# Patient Record
Sex: Male | Born: 1975 | Race: White | Hispanic: No | Marital: Single | Smoking: Current every day smoker
Health system: Southern US, Community
[De-identification: ages and names within clinical notes are randomized; demographics above are authoritative.]

## PROBLEM LIST (undated history)

## (undated) DIAGNOSIS — I10 Essential (primary) hypertension: Secondary | ICD-10-CM

---

## 2017-10-03 ENCOUNTER — Encounter (HOSPITAL_COMMUNITY): Payer: Self-pay | Admitting: Emergency Medicine

## 2017-10-03 ENCOUNTER — Inpatient Hospital Stay (HOSPITAL_COMMUNITY)
Admission: EM | Admit: 2017-10-03 | Discharge: 2017-10-06 | DRG: 897 | Disposition: A | Discharge status: Home / Self Care | Payer: Self-pay | Attending: Internal Medicine | Admitting: Internal Medicine

## 2017-10-03 ENCOUNTER — Emergency Department (HOSPITAL_COMMUNITY): Payer: Self-pay

## 2017-10-03 DIAGNOSIS — F419 Anxiety disorder, unspecified: Secondary | ICD-10-CM | POA: Diagnosis present

## 2017-10-03 DIAGNOSIS — F17200 Nicotine dependence, unspecified, uncomplicated: Secondary | ICD-10-CM | POA: Diagnosis present

## 2017-10-03 DIAGNOSIS — F10239 Alcohol dependence with withdrawal, unspecified: Secondary | ICD-10-CM

## 2017-10-03 DIAGNOSIS — R441 Visual hallucinations: Secondary | ICD-10-CM

## 2017-10-03 DIAGNOSIS — L237 Allergic contact dermatitis due to plants, except food: Secondary | ICD-10-CM | POA: Diagnosis present

## 2017-10-03 DIAGNOSIS — Z79899 Other long term (current) drug therapy: Secondary | ICD-10-CM

## 2017-10-03 DIAGNOSIS — E876 Hypokalemia: Secondary | ICD-10-CM

## 2017-10-03 DIAGNOSIS — D509 Iron deficiency anemia, unspecified: Secondary | ICD-10-CM | POA: Diagnosis present

## 2017-10-03 DIAGNOSIS — F22 Delusional disorders: Secondary | ICD-10-CM

## 2017-10-03 DIAGNOSIS — I10 Essential (primary) hypertension: Secondary | ICD-10-CM | POA: Diagnosis present

## 2017-10-03 DIAGNOSIS — E872 Acidosis: Secondary | ICD-10-CM | POA: Diagnosis present

## 2017-10-03 DIAGNOSIS — F10939 Alcohol use, unspecified with withdrawal, unspecified: Secondary | ICD-10-CM | POA: Diagnosis present

## 2017-10-03 DIAGNOSIS — F329 Major depressive disorder, single episode, unspecified: Secondary | ICD-10-CM | POA: Diagnosis present

## 2017-10-03 DIAGNOSIS — N179 Acute kidney failure, unspecified: Secondary | ICD-10-CM | POA: Insufficient documentation

## 2017-10-03 DIAGNOSIS — F10231 Alcohol dependence with withdrawal delirium: Principal | ICD-10-CM | POA: Diagnosis present

## 2017-10-03 DIAGNOSIS — K7010 Alcoholic hepatitis without ascites: Secondary | ICD-10-CM | POA: Diagnosis present

## 2017-10-03 DIAGNOSIS — E871 Hypo-osmolality and hyponatremia: Secondary | ICD-10-CM

## 2017-10-03 DIAGNOSIS — R440 Auditory hallucinations: Secondary | ICD-10-CM

## 2017-10-03 DIAGNOSIS — F1210 Cannabis abuse, uncomplicated: Secondary | ICD-10-CM | POA: Diagnosis present

## 2017-10-03 DIAGNOSIS — F1110 Opioid abuse, uncomplicated: Secondary | ICD-10-CM | POA: Diagnosis present

## 2017-10-03 HISTORY — DX: Essential (primary) hypertension: I10

## 2017-10-03 LAB — URINALYSIS, ROUTINE W REFLEX MICROSCOPIC
Bacteria, UA: NONE SEEN
Bilirubin Urine: NEGATIVE
Glucose, UA: NEGATIVE mg/dL
Hgb urine dipstick: NEGATIVE
KETONES UR: 20 mg/dL — AB
Leukocytes, UA: NEGATIVE
Nitrite: NEGATIVE
PH: 5 (ref 5.0–8.0)
PROTEIN: 30 mg/dL — AB
SPECIFIC GRAVITY, URINE: 1.021 (ref 1.005–1.030)

## 2017-10-03 LAB — COMPREHENSIVE METABOLIC PANEL
ALBUMIN: 4.6 g/dL (ref 3.5–5.0)
ALT: 87 U/L — ABNORMAL HIGH (ref 17–63)
AST: 82 U/L — AB (ref 15–41)
Alkaline Phosphatase: 136 U/L — ABNORMAL HIGH (ref 38–126)
Anion gap: 19 — ABNORMAL HIGH (ref 5–15)
BUN: 16 mg/dL (ref 6–20)
CHLORIDE: 98 mmol/L — AB (ref 101–111)
CO2: 16 mmol/L — ABNORMAL LOW (ref 22–32)
Calcium: 9.3 mg/dL (ref 8.9–10.3)
Creatinine, Ser: 1.26 mg/dL — ABNORMAL HIGH (ref 0.61–1.24)
GFR calc Af Amer: >60 mL/min (ref >60)
GFR calc non Af Amer: >60 mL/min (ref >60)
Glucose, Bld: 94 mg/dL (ref 65–99)
POTASSIUM: 3 mmol/L — AB (ref 3.5–5.1)
Sodium: 133 mmol/L — ABNORMAL LOW (ref 135–145)
Total Bilirubin: 2 mg/dL — ABNORMAL HIGH (ref 0.3–1.2)
Total Protein: 7.5 g/dL (ref 6.5–8.1)

## 2017-10-03 LAB — CBC WITH DIFFERENTIAL/PLATELET
BASOS ABS: 0 10*3/uL (ref 0.0–0.1)
BASOS PCT: 0 %
Basophils Absolute: 0 K/uL (ref 0.0–0.1)
Basophils Relative: 0 %
EOS ABS: 0 10*3/uL (ref 0.0–0.7)
EOS PCT: 0 %
Eosinophils Absolute: 0.1 K/uL (ref 0.0–0.7)
Eosinophils Relative: 1 %
HCT: 30.1 % — ABNORMAL LOW (ref 39.0–52.0)
HCT: 27.1 % — ABNORMAL LOW (ref 39.0–52.0)
Hemoglobin: 10 g/dL — ABNORMAL LOW (ref 13.0–17.0)
Hemoglobin: 9 g/dL — ABNORMAL LOW (ref 13.0–17.0)
LYMPHS ABS: 2 10*3/uL (ref 0.7–4.0)
Lymphocytes Relative: 17 %
Lymphocytes Relative: 28 %
Lymphs Abs: 1.6 K/uL (ref 0.7–4.0)
MCH: 30.2 pg (ref 26.0–34.0)
MCH: 30.3 pg (ref 26.0–34.0)
MCHC: 33.2 g/dL (ref 30.0–36.0)
MCHC: 33.2 g/dL (ref 30.0–36.0)
MCV: 91.2 fL (ref 78.0–100.0)
MCV: 90.9 fL (ref 78.0–100.0)
Monocytes Absolute: 1.5 10*3/uL — ABNORMAL HIGH (ref 0.1–1.0)
Monocytes Absolute: 0.7 K/uL (ref 0.1–1.0)
Monocytes Relative: 13 %
Monocytes Relative: 12 %
Neutro Abs: 8.2 10*3/uL — ABNORMAL HIGH (ref 1.7–7.7)
Neutro Abs: 3.4 K/uL (ref 1.7–7.7)
Neutrophils Relative %: 70 %
Neutrophils Relative %: 59 %
PLATELETS: 301 10*3/uL (ref 150–400)
Platelets: 276 K/uL (ref 150–400)
RBC: 2.98 MIL/uL — ABNORMAL LOW (ref 4.22–5.81)
RBC: 3.3 MIL/uL — AB (ref 4.22–5.81)
RDW: 15 % (ref 11.5–15.5)
RDW: 15.1 % (ref 11.5–15.5)
WBC: 11.7 10*3/uL — AB (ref 4.0–10.5)
WBC: 5.8 K/uL (ref 4.0–10.5)

## 2017-10-03 LAB — BASIC METABOLIC PANEL WITH GFR
Anion gap: 10 (ref 5–15)
BUN: 10 mg/dL (ref 6–20)
CO2: 20 mmol/L — ABNORMAL LOW (ref 22–32)
Calcium: 8.1 mg/dL — ABNORMAL LOW (ref 8.9–10.3)
Chloride: 110 mmol/L (ref 101–111)
Creatinine, Ser: 0.78 mg/dL (ref 0.61–1.24)
GFR calc Af Amer: >60 mL/min
GFR calc non Af Amer: >60 mL/min
Glucose, Bld: 97 mg/dL (ref 65–99)
Potassium: 3.1 mmol/L — ABNORMAL LOW (ref 3.5–5.1)
Sodium: 140 mmol/L (ref 135–145)

## 2017-10-03 LAB — LACTIC ACID, PLASMA: Lactic Acid, Venous: 0.5 mmol/L (ref 0.5–1.9)

## 2017-10-03 LAB — GLUCOSE, CAPILLARY
Glucose-Capillary: 105 mg/dL — ABNORMAL HIGH (ref 65–99)
Glucose-Capillary: 151 mg/dL — ABNORMAL HIGH (ref 65–99)

## 2017-10-03 LAB — SALICYLATE LEVEL

## 2017-10-03 LAB — RAPID URINE DRUG SCREEN, HOSP PERFORMED
AMPHETAMINES: NOT DETECTED
BARBITURATES: POSITIVE — AB
BENZODIAZEPINES: NOT DETECTED
Cocaine: NOT DETECTED
Opiates: NOT DETECTED
TETRAHYDROCANNABINOL: POSITIVE — AB

## 2017-10-03 LAB — ETHANOL: Alcohol, Ethyl (B): <10 mg/dL (ref <10)

## 2017-10-03 LAB — MRSA PCR SCREENING: MRSA by PCR: NEGATIVE

## 2017-10-03 LAB — ACETAMINOPHEN LEVEL: Acetaminophen (Tylenol), Serum: <10 ug/mL — ABNORMAL LOW (ref 10–30)

## 2017-10-03 MED ORDER — LORAZEPAM 2 MG/ML IJ SOLN
0.0000 mg | Freq: Four times a day (QID) | INTRAMUSCULAR | Status: DC
  Administered 2017-10-03: 1 mg via INTRAVENOUS
  Administered 2017-10-03 (×2): 2 mg via INTRAVENOUS
  Filled 2017-10-03 (×3): qty 1

## 2017-10-03 MED ORDER — ASPIRIN 81 MG PO CHEW: 324.0000 mg | CHEWABLE_TABLET | ORAL | Status: DC

## 2017-10-03 MED ORDER — LORAZEPAM 1 MG PO TABS: 0.0000 mg | ORAL_TABLET | Freq: Two times a day (BID) | ORAL | Status: DC

## 2017-10-03 MED ORDER — AMLODIPINE BESYLATE 5 MG PO TABS
5.0000 mg | ORAL_TABLET | Freq: Every day | ORAL | Status: DC
  Administered 2017-10-04 – 2017-10-06 (×3): 5 mg via ORAL
  Filled 2017-10-03 (×4): qty 1

## 2017-10-03 MED ORDER — DEXMEDETOMIDINE HCL IN NACL 200 MCG/50ML IV SOLN
0.2000 ug/kg/h | INTRAVENOUS | Status: DC
  Filled 2017-10-03: qty 50

## 2017-10-03 MED ORDER — LORAZEPAM 2 MG/ML IJ SOLN
1.0000 mg | Freq: Once | INTRAMUSCULAR | Status: AC
  Administered 2017-10-03: 1 mg via INTRAMUSCULAR
  Filled 2017-10-03: qty 1

## 2017-10-03 MED ORDER — DICYCLOMINE HCL 10 MG PO CAPS
10.0000 mg | ORAL_CAPSULE | Freq: Three times a day (TID) | ORAL | Status: DC | PRN
  Filled 2017-10-03: qty 1

## 2017-10-03 MED ORDER — LORAZEPAM 1 MG PO TABS
0.0000 mg | ORAL_TABLET | Freq: Four times a day (QID) | ORAL | Status: DC
  Administered 2017-10-04 (×2): 1 mg via ORAL
  Filled 2017-10-03: qty 1
  Filled 2017-10-03: qty 2
  Filled 2017-10-03: qty 1

## 2017-10-03 MED ORDER — ASPIRIN 300 MG RE SUPP: 300.0000 mg | RECTAL | Status: DC

## 2017-10-03 MED ORDER — DEXMEDETOMIDINE HCL IN NACL 200 MCG/50ML IV SOLN: 0.4000 ug/kg/h | INTRAVENOUS | Status: DC

## 2017-10-03 MED ORDER — ONDANSETRON HCL 4 MG PO TABS
4.0000 mg | ORAL_TABLET | Freq: Three times a day (TID) | ORAL | Status: DC | PRN
  Administered 2017-10-04: 4 mg via ORAL
  Filled 2017-10-03: qty 1

## 2017-10-03 MED ORDER — POTASSIUM CHLORIDE 10 MEQ/100ML IV SOLN: 10.0000 meq | INTRAVENOUS | Status: DC

## 2017-10-03 MED ORDER — THIAMINE HCL 100 MG/ML IJ SOLN
100.0000 mg | Freq: Every day | INTRAMUSCULAR | Status: DC
  Administered 2017-10-03: 100 mg via INTRAVENOUS
  Filled 2017-10-03 (×2): qty 2
  Filled 2017-10-03: qty 1

## 2017-10-03 MED ORDER — SODIUM CHLORIDE 0.9 % IV BOLUS
1000.0000 mL | Freq: Once | INTRAVENOUS | Status: AC
  Administered 2017-10-03: 1000 mL via INTRAVENOUS

## 2017-10-03 MED ORDER — LORAZEPAM 2 MG/ML IJ SOLN: 0.0000 mg | Freq: Two times a day (BID) | INTRAMUSCULAR | Status: DC

## 2017-10-03 MED ORDER — DEXMEDETOMIDINE HCL IN NACL 200 MCG/50ML IV SOLN
0.4000 ug/kg/h | INTRAVENOUS | Status: DC
  Administered 2017-10-03: 0.4 ug/kg/h via INTRAVENOUS
  Filled 2017-10-03 (×2): qty 50

## 2017-10-03 MED ORDER — LOSARTAN POTASSIUM 50 MG PO TABS
100.0000 mg | ORAL_TABLET | Freq: Every day | ORAL | Status: DC
  Administered 2017-10-04 – 2017-10-06 (×3): 100 mg via ORAL
  Filled 2017-10-03 (×4): qty 2

## 2017-10-03 MED ORDER — THIAMINE HCL 100 MG/ML IJ SOLN
INTRAVENOUS | Status: DC
  Administered 2017-10-03 – 2017-10-04 (×2): via INTRAVENOUS
  Filled 2017-10-03 (×10): qty 1000

## 2017-10-03 MED ORDER — BUTALBITAL-APAP-CAFFEINE 50-325-40 MG PO TABS
1.0000 | ORAL_TABLET | ORAL | Status: DC | PRN
Start: 2017-10-03 — End: 2017-10-06
  Administered 2017-10-03 – 2017-10-06 (×9): 1 via ORAL
  Filled 2017-10-03 (×9): qty 1

## 2017-10-03 MED ORDER — DEXMEDETOMIDINE HCL IN NACL 400 MCG/100ML IV SOLN
0.2000 ug/kg/h | INTRAVENOUS | Status: DC
  Administered 2017-10-03: 0.2 ug/kg/h via INTRAVENOUS
  Filled 2017-10-03: qty 100

## 2017-10-03 MED ORDER — SODIUM CHLORIDE 0.9 % IV SOLN
250.0000 mL | INTRAVENOUS | Status: DC | PRN
  Administered 2017-10-03: 250 mL via INTRAVENOUS

## 2017-10-03 MED ORDER — POTASSIUM CHLORIDE CRYS ER 20 MEQ PO TBCR
40.0000 meq | EXTENDED_RELEASE_TABLET | Freq: Once | ORAL | Status: AC
  Administered 2017-10-03: 40 meq via ORAL
  Filled 2017-10-03 (×2): qty 2

## 2017-10-03 MED ORDER — ENOXAPARIN SODIUM 40 MG/0.4ML ~~LOC~~ SOLN
40.0000 mg | SUBCUTANEOUS | Status: DC
  Administered 2017-10-03 – 2017-10-06 (×4): 40 mg via SUBCUTANEOUS
  Filled 2017-10-03 (×4): qty 0.4

## 2017-10-03 MED ORDER — VITAMIN B-1 100 MG PO TABS
100.0000 mg | ORAL_TABLET | Freq: Every day | ORAL | Status: DC
  Administered 2017-10-04: 100 mg via ORAL
  Filled 2017-10-03 (×2): qty 1

## 2017-10-03 MED ORDER — LORAZEPAM 2 MG/ML IJ SOLN
1.0000 mg | INTRAMUSCULAR | Status: DC | PRN
  Administered 2017-10-04: 1 mg via INTRAVENOUS
  Filled 2017-10-03: qty 1

## 2017-10-03 MED ORDER — LORAZEPAM 2 MG/ML IJ SOLN
2.0000 mg | Freq: Once | INTRAMUSCULAR | Status: AC
  Administered 2017-10-03: 2 mg via INTRAMUSCULAR
  Filled 2017-10-03: qty 1

## 2017-10-03 MED ORDER — LORAZEPAM 2 MG/ML IJ SOLN
2.0000 mg | INTRAMUSCULAR | Status: AC
  Administered 2017-10-03: 2 mg via INTRAMUSCULAR
  Filled 2017-10-03: qty 1

## 2017-10-03 MED ORDER — SODIUM CHLORIDE 0.9 % IV SOLN
INTRAVENOUS | Status: DC
  Administered 2017-10-03 – 2017-10-04 (×3): via INTRAVENOUS

## 2017-10-03 NOTE — H&P (Signed)
PULMONARY / CRITICAL CARE MEDICINE   Name: Terry Burns MRN: [id number] DOB: 22-Jul-1975    ADMISSION DATE:  10/03/2017 CONSULTATION DATE:  10/03/2017  REFERRING MD:  ED  CHIEF COMPLAINT: delirium   HISTORY OF PRESENT ILLNESS:   42 yr old male with ETOH abuse and HTN he comes in with hallucinations to the ED. Early in the morning while in the ED he started showing signs of ETOH withdrawal. eICU were called for admission. I was paged by the ED nurse since patient has not been evaluated by ICU and I was not informed about it earlier. I came immediately to assess the patient in ED he was on precedex and was sleepy. I tried to wake him up but he was lethargic and could not take any history from him and no family around except sitter.   PAST MEDICAL HISTORY :  He  has a past medical history of Hypertension.  PAST SURGICAL HISTORY: He  has no past surgical history on file.  Allergies  Allergen Reactions  . Augmentin [Amoxicillin-Pot Clavulanate] Nausea And Vomiting  . Ciprofloxacin Other (See Comments)    No current facility-administered medications on file prior to encounter.    Current Outpatient Medications on File Prior to Encounter  Medication Sig  . amLODipine (NORVASC) 5 MG tablet Take 5 mg by mouth daily.  Marland Kitchen dicyclomine (BENTYL) 10 MG capsule Take 10 mg by mouth 3 (three) times daily as needed for spasms.  Marland Kitchen losartan (COZAAR) 100 MG tablet Take 100 mg by mouth daily.  . ondansetron (ZOFRAN) 4 MG tablet Take 4 mg by mouth every 8 (eight) hours as needed for nausea or vomiting.    FAMILY HISTORY:  Could not be obtained due to altered mental status   SOCIAL HISTORY: Could not be obtained due to altered mental status   REVIEW OF SYSTEMS:   Could not be obtained due to altered mental status      VITAL SIGNS: BP 100/89   Pulse 90   Temp 98.9 F (37.2 C) (Oral)   Resp 17   Ht  (1.854 m)   Wt 72.8 kg (160 lb 7.9 oz)   SpO2 96%   BMI 21.17 kg/m    HEMODYNAMICS:    VENTILATOR SETTINGS:    INTAKE / OUTPUT: No intake/output data recorded.  PHYSICAL EXAMINATION: General:  Lethargic on precedex drip Neuro: lethargic moving all extremities CN 1-12 intact  HEENT:  Moist  Cardiovascular:  Normal heart sounds no murmrurs  Lungs: clear no wheezing no crackles  Abdomen:  Soft no tednerenss  Musculoskeletal:  No edema  Skin:  No rash  LABS:  BMET Recent Labs  Lab 10/03/17 0057  NA 133*  K 3.0*  CL 98*  CO2 16*  BUN 16  CREATININE 1.26*  GLUCOSE 94    Electrolytes Recent Labs  Lab 10/03/17 0057  CALCIUM 9.3    CBC Recent Labs  Lab 10/03/17 0057  WBC 11.7*  HGB 10.0*  HCT 30.1*  PLT 301    Coag's No results for input(s): APTT, INR in the last 168 hours.  Sepsis Markers No results for input(s): LATICACIDVEN, PROCALCITON, O2SATVEN in the last 168 hours.  ABG No results for input(s): PHART, PCO2ART, PO2ART in the last 168 hours.  Liver Enzymes Recent Labs  Lab 10/03/17 0057  AST 82*  ALT 87*  ALKPHOS 136*  BILITOT 2.0*  ALBUMIN 4.6    Cardiac Enzymes No results for input(s): TROPONINI, PROBNP in the last 168 hours.  Glucose No results for input(s): GLUCAP in the last 168 hours.  Imaging Ct Head Wo Contrast  Result Date: 10/03/2017 CLINICAL DATA:  42 year old male with hallucination. EXAM: CT HEAD WITHOUT CONTRAST TECHNIQUE: Contiguous axial images were obtained from the base of the skull through the vertex without intravenous contrast. COMPARISON:  None. FINDINGS: Brain: The ventricles and sulci appropriate size for patient's age. The gray-white matter discrimination is preserved. There is no acute intracranial hemorrhage. No mass effect or midline shift. Mild prominence of the CSF space in the posterior fossa may represent an arachnoid cyst or mildly dilated cisterna magna. Vascular: No hyperdense vessel or unexpected calcification. Skull: Normal. Negative for fracture or focal lesion.  Sinuses/Orbits: No acute finding. Other: None IMPRESSION: No acute intracranial pathology. Electronically Signed   By: Elgie Collard M.D.   On: 10/03/2017 01:52      ASSESSMENT / PLAN:  Severe alcohol withdrawal with delerium tremens Hyponatremia Hypokalemia AKI Metabolic acidosis  Acute hepatitis seondary to alcohol  Anemia no signs for bleeding   Plan: - precedex drip - IVF  - send for lactic acid - repeat BMP - replace potasium - follow sodium level - CIWA protocol  - thiamin and MVI  - follow urine output and renal function - admit to ICU      Pulmonary and Critical Care Medicine [facility] Pager: [phone]  10/03/2017, 1:34 PM

## 2017-10-03 NOTE — ED Notes (Signed)
CCM at bedside 

## 2017-10-03 NOTE — ED Notes (Signed)
Pt nose ring remains, unable to be removed.

## 2017-10-03 NOTE — ED Notes (Signed)
Sitter at the bedside.

## 2017-10-03 NOTE — ED Notes (Signed)
repaged iintensivist
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
## 2017-10-03 NOTE — ED Notes (Signed)
Pt states his wife Jotham Ahn [phone]) can speak with any staff member about his plan of care

## 2017-10-03 NOTE — ED Notes (Signed)
Notified pharmacy for replacement precedex.

## 2017-10-03 NOTE — ED Notes (Signed)
Pt wife updated on change in status, very appreciative.  [facility] also updated.

## 2017-10-03 NOTE — Plan of Care (Signed)

## 2017-10-03 NOTE — ED Notes (Signed)
Belongings removed, pt placed in paper scrubs. Wanded by security. Family taking ALL belongings.

## 2017-10-03 NOTE — ED Triage Notes (Signed)
Pt brought in with sister and wife, per family pt has been hallucinating for past few days as well as not sleeping. Also reports pt has been abusing ETOH and anti anxiety medications. Several family issues going on, pt has been under a lot of stress. Denies SI/HI. Pt has no psychiatric hx. Dr. Elesa Massed in triage to evaluate pt.

## 2017-10-03 NOTE — Progress Notes (Signed)
TTS received a phone call from [place], RN who states the pt is being admitted medically to ICU due to alcohol withdrawal.  Princess Bruins, MSW, LCSW Therapeutic Triage Specialist  [phone]

## 2017-10-03 NOTE — ED Notes (Signed)
Md made aware of Pt's CIWA; to be moved to room 23

## 2017-10-03 NOTE — Progress Notes (Signed)
Pt.'s wife at bedside and states her husband has had "chronic diarrhea and N/V for the past year now" and has a GI doctor and GP that he sees regularly. Pt.'s wife also stated that the pt. has many home medications that are not listed in out system. Wife informed that an updated home medication list would be valuable and she stated she could bring from home. Dr. Minette Headland informed of the information listed above.

## 2017-10-03 NOTE — ED Notes (Signed)
Patient continues to speak to people that are not there; ripped his paper scrubs and states that he was "working on Nurse, learning disability" and then asked for shorts. I obliged patient and cut off his paper scrubs to make shorts. Patient repeatedly stated that he was wearing "magic shorts" and frequently yells out random names and "stop!".

## 2017-10-03 NOTE — ED Provider Notes (Addendum)
TIME SEEN: 12:53 AM  CHIEF COMPLAINT: Hallucinations, paranoia  HPI: Patient is a 42 year old male with history of hypertension, alcohol abuse who presents to the emergency department with hallucinations and paranoia.  Patient reports this is been ongoing for several days.  He states that he feels like neighbors have been on the roof of his house trying to get into his house, breaking and stealing things and putting devices to listen to him.  He states that he is getting scared because he realizes that no one else sees these things.  He has never had any psychiatric illness before.  No history of psychiatric hospitalization.  Reports that he does drink regularly approximately 6-9 drinks a day but does not drink every day and has never had withdrawal or hallucinations before.  Did drink 2 glasses of wine tonight.  No drug use.  No head injury.  No medical complaints.  Sister had an episode of similar symptoms years ago and required psychiatric hospitalization for a "psychotic break".  ROS: See HPI Constitutional: no fever  Eyes: no drainage  ENT: no runny nose   Cardiovascular:  no chest pain  Resp: no SOB  GI: no vomiting GU: no dysuria Integumentary: no rash  Allergy: no hives  Musculoskeletal: no leg swelling  Neurological: no slurred speech ROS otherwise negative  PAST MEDICAL HISTORY/PAST SURGICAL HISTORY:  Past Medical History:  Diagnosis Date  . Hypertension     MEDICATIONS:  Prior to Admission medications   Not on File    ALLERGIES:  Allergies not on file  SOCIAL HISTORY:  Social History   Tobacco Use  . Smoking status: Not on file  . Smokeless tobacco: Never Used  Substance Use Topics  . Alcohol use: Yes    FAMILY HISTORY: No family history on file.  EXAM: BP (!) 147/89 (BP Location: Right Arm)   Pulse 100   Temp 98.9 F (37.2 C) (Oral)   Resp 16   SpO2 100%  CONSTITUTIONAL: Alert and oriented and responds appropriately to questions. Well-appearing;  well-nourished, appears very nervous and is looking around the room rapidly HEAD: Normocephalic EYES: Conjunctivae clear, pupils appear equal, EOMI ENT: normal nose; moist mucous membranes NECK: Supple, no meningismus, no nuchal rigidity, no LAD  CARD: RRR; S1 and S2 appreciated; no murmurs, no clicks, no rubs, no gallops RESP: Normal chest excursion without splinting or tachypnea; breath sounds clear and equal bilaterally; no wheezes, no rhonchi, no rales, no hypoxia or respiratory distress, speaking full sentences ABD/GI: Normal bowel sounds; non-distended; soft, non-tender, no rebound, no guarding, no peritoneal signs, no hepatosplenomegaly BACK:  The back appears normal and is non-tender to palpation, there is no CVA tenderness EXT: Normal ROM in all joints; non-tender to palpation; no edema; normal capillary refill; no cyanosis, no calf tenderness or swelling    SKIN: Normal color for age and race; warm; no rash NEURO: Moves all extremities equally PSYCH: Endorses hallucinations, paranoia.  Denies SI or HI.  Appears very anxious, nervous.  MEDICAL DECISION MAKING: Patient here with hallucinations, paranoia.  History of sister with similar symptoms.  Family at bedside very concerned for his health.  At this time he agrees to stay voluntarily.  We will start with a medical work-up including labs, urine, EKG, head CT.  If this is unremarkable, will consult TTS.  He agrees to take a dose of IM Ativan to help with his restlessness, anxiety.  I do feel that if patient were to decide to leave the emergency department he  would need to be involuntarily committed.  I have filled out his IVC paperwork.  ED PROGRESS: 2:00 AM  Patient's medical work-up is unremarkable other than some mild dehydration.  Labs show mild bump in creatinine of 1.26, urine shows small ketones.  Will encourage oral fluids.  I do not think this is the cause of his psychosis today.  He does have mildly elevated liver function test  likely in the setting of alcohol abuse.  His alcohol is negative today.  Drug screen is positive for barbiturates and THC.   We will consult TTS.  At this time he is medically cleared.  Patient is comfortable with this plan and agrees to stay voluntarily at this time for evaluation by TTS.  The Ativan has helped calm him down he is still feeling very "jittery".  Will give another dose of IM Ativan after TTS has talked to the patient.  2:45 AM  D/w Ala Dach with TTS.  They have recommended inpatient psychiatric treatment and I agree.  They will look for placement.  Nursing staff has updated patient and family.   I reviewed all nursing notes, vitals, pertinent previous records, EKGs, lab and urine results, imaging (as available).     EKG Interpretation  Date/Time:  Saturday Oct 03 2017 01:30:53 EDT Ventricular Rate:  96 PR Interval:  158 QRS Duration: 92 QT Interval:  388 QTC Calculation: 490 R Axis:   73 Text Interpretation:  Normal sinus rhythm Prolonged QT Abnormal ECG No old tracing to compare Confirmed by Ward, Baxter Hire [phone]) on 10/03/2017 1:42:01 AM         Ward, Layla Maw, DO 10/03/17 0247     5:45 AM I was asked to reevaluate patient by nursing staff.  Patient is now tachycardic, diaphoretic, extremely agitated, confused, incoherent.  His CIWAs are in the upper 20s.  This is despite multiple rounds of oral and IM Ativan.  I suspect now that he may be having symptoms of alcohol withdrawal.  I feel he will need Precedex infusion and admission to the ICU.  6:00 AM  D/w Dr. Darrick Penna with critical care.  They will see patient for admission.   CRITICAL CARE Performed by: Rochele Raring   Total critical care time: 45 minutes  Critical care time was exclusive of separately billable procedures and treating other patients.  Critical care was necessary to treat or prevent imminent or life-threatening deterioration.  Critical care was time spent personally by me on the following  activities: development of treatment plan with patient and/or surrogate as well as nursing, discussions with consultants, evaluation of patient's response to treatment, examination of patient, obtaining history from patient or surrogate, ordering and performing treatments and interventions, ordering and review of laboratory studies, ordering and review of radiographic studies, pulse oximetry and re-evaluation of patient's condition.    Ward, Layla Maw, DO 10/03/17 [phone]

## 2017-10-04 DIAGNOSIS — F10231 Alcohol dependence with withdrawal delirium: Principal | ICD-10-CM

## 2017-10-04 DIAGNOSIS — K7010 Alcoholic hepatitis without ascites: Secondary | ICD-10-CM

## 2017-10-04 LAB — BASIC METABOLIC PANEL
Anion gap: 7 (ref 5–15)
BUN: 7 mg/dL (ref 6–20)
CALCIUM: 7.9 mg/dL — AB (ref 8.9–10.3)
CO2: 21 mmol/L — ABNORMAL LOW (ref 22–32)
Chloride: 113 mmol/L — ABNORMAL HIGH (ref 101–111)
Creatinine, Ser: 0.64 mg/dL (ref 0.61–1.24)
GFR calc Af Amer: >60 mL/min (ref >60)
Glucose, Bld: 97 mg/dL (ref 65–99)
POTASSIUM: 3.5 mmol/L (ref 3.5–5.1)
Sodium: 141 mmol/L (ref 135–145)

## 2017-10-04 LAB — CBC
HEMATOCRIT: 25.9 % — AB (ref 39.0–52.0)
Hemoglobin: 8.5 g/dL — ABNORMAL LOW (ref 13.0–17.0)
MCH: 30.1 pg (ref 26.0–34.0)
MCHC: 32.8 g/dL (ref 30.0–36.0)
MCV: 91.8 fL (ref 78.0–100.0)
Platelets: 272 10*3/uL (ref 150–400)
RBC: 2.82 MIL/uL — ABNORMAL LOW (ref 4.22–5.81)
RDW: 15.2 % (ref 11.5–15.5)
WBC: 5.1 10*3/uL (ref 4.0–10.5)

## 2017-10-04 LAB — HIV ANTIBODY (ROUTINE TESTING W REFLEX): HIV Screen 4th Generation wRfx: NONREACTIVE

## 2017-10-04 LAB — GLUCOSE, CAPILLARY: GLUCOSE-CAPILLARY: 114 mg/dL — AB (ref 65–99)

## 2017-10-04 MED ORDER — TRAZODONE HCL 100 MG PO TABS
100.0000 mg | ORAL_TABLET | Freq: Every evening | ORAL | Status: DC | PRN
  Administered 2017-10-04 – 2017-10-05 (×2): 100 mg via ORAL
  Filled 2017-10-04 (×3): qty 1

## 2017-10-04 MED ORDER — LORAZEPAM 1 MG PO TABS
0.0000 mg | ORAL_TABLET | Freq: Four times a day (QID) | ORAL | Status: AC
  Administered 2017-10-04: 2 mg via ORAL
  Administered 2017-10-04: 1 mg via ORAL
  Filled 2017-10-04: qty 2
  Filled 2017-10-04 (×2): qty 1
  Filled 2017-10-04: qty 2

## 2017-10-04 MED ORDER — DIPHENHYDRAMINE HCL 25 MG PO CAPS
25.0000 mg | ORAL_CAPSULE | Freq: Four times a day (QID) | ORAL | Status: DC | PRN
  Administered 2017-10-04 – 2017-10-05 (×2): 25 mg via ORAL
  Filled 2017-10-04 (×2): qty 1

## 2017-10-04 MED ORDER — LORAZEPAM 2 MG/ML IJ SOLN
1.0000 mg | Freq: Four times a day (QID) | INTRAMUSCULAR | Status: DC | PRN
  Administered 2017-10-05: 1 mg via INTRAVENOUS
  Filled 2017-10-04: qty 1

## 2017-10-04 MED ORDER — LORAZEPAM 1 MG PO TABS: 0.0000 mg | ORAL_TABLET | Freq: Two times a day (BID) | ORAL | Status: DC

## 2017-10-04 MED ORDER — CALAMINE EX LOTN
TOPICAL_LOTION | CUTANEOUS | Status: DC | PRN
  Administered 2017-10-05: 12:00:00 via TOPICAL
  Filled 2017-10-04: qty 177

## 2017-10-04 MED ORDER — LORAZEPAM 1 MG PO TABS
1.0000 mg | ORAL_TABLET | Freq: Four times a day (QID) | ORAL | Status: DC | PRN
  Administered 2017-10-04 – 2017-10-06 (×3): 1 mg via ORAL
  Filled 2017-10-04 (×2): qty 1

## 2017-10-04 NOTE — Progress Notes (Signed)
PULMONARY / CRITICAL CARE MEDICINE   Name: Terry Burns MRN: [id number] DOB: [birth year]    ADMISSION DATE:  10/03/2017 CONSULTATION DATE:  10/03/2017  REFERRING MD:  ED  CHIEF COMPLAINT: delirium   BRIEF:   HISTORY OF PRESENT ILLNESS:   42 y/o male with EtOH abuse was admitted for EtOH withdrawal.  PAST MEDICAL HISTORY :  He  has a past medical history of Hypertension.  SUBJECTIVE: Feels much better this morning Precedex off   VITAL SIGNS: BP (!) 142/86   Pulse 96   Temp 98.3 F (36.8 C) (Oral)   Resp 16   Ht  (1.854 m)   Wt 147 lb 0.8 oz (66.7 kg)   SpO2 100%   BMI 19.40 kg/m   HEMODYNAMICS:    VENTILATOR SETTINGS:    INTAKE / OUTPUT: I/O last 3 completed shifts: In: 3855.4 [P.O.:240; I.V.:3615.4] Out: 680 [Urine:680]  PHYSICAL EXAMINATION:  General:  Resting comfortably in bed HENT: NCAT OP clear PULM: CTA B, normal effort CV: RRR, no mgr GI: BS+, soft, nontender MSK: normal bulk and tone Neuro: awake, alert, no distress, MAEW    LABS:  BMET Recent Labs  Lab 10/03/17 0057 10/03/17 1521 10/04/17 0526  NA 133* 140 141  K 3.0* 3.1* 3.5  CL 98* 110 113*  CO2 16* 20* 21*  BUN CREATININE 1.26* 0.78 0.64  GLUCOSE 94 97 97    Electrolytes Recent Labs  Lab 10/03/17 0057 10/03/17 1521 10/04/17 0526  CALCIUM 9.3 8.1* 7.9*    CBC Recent Labs  Lab 10/03/17 0057 10/03/17 1521 10/04/17 0526  WBC 11.7* 5.8 5.1  HGB 10.0* 9.0* 8.5*  HCT 30.1* 27.1* 25.9*  PLT 301 276 272    Coag's No results for input(s): APTT, INR in the last 168 hours.  Sepsis Markers Recent Labs  Lab 10/03/17 1521  LATICACIDVEN 0.5    ABG No results for input(s): PHART, PCO2ART, PO2ART in the last 168 hours.  Liver Enzymes Recent Labs  Lab 10/03/17 0057  AST 82*  ALT 87*  ALKPHOS 136*  BILITOT 2.0*  ALBUMIN 4.6    Cardiac Enzymes No results for input(s): TROPONINI, PROBNP in the last 168 hours.  Glucose Recent Labs  Lab  10/03/17 1506 10/03/17 2027 10/04/17 0035  GLUCAP 105* 151* 114*    Imaging No results found.    ASSESSMENT / PLAN:  Severe EtOH withdrawal with hallucinations, delirium tremens AKI> improved Mild alcoholic hepatitis Chronic normocytic anemia without bleeding, likely due to EtOH use   Plan: Stop precedex Continue thiamine Continue MVI Advance diet  Continue PRN ativan with CIWA Trasnfer to floor, TRH   Heber [place], MD [facility] Pager: [phone] Cell: [phone] After 3pm or if no response, call [phone]   10/04/2017, 10:35 AM

## 2017-10-04 NOTE — Progress Notes (Signed)
Report called to 5W 7 jRN, tx w/ vss w/ Lona Kettle. Wife updated to new room and phone #.

## 2017-10-04 NOTE — Progress Notes (Signed)
[facility]  He tells me he is not depressed nor have any intention to kill himself.    Heber Cornland, MD [facility] Pager: [phone] Cell: [phone] After 3pm or if no response, call [phone]

## 2017-10-04 NOTE — Progress Notes (Signed)
Patient trasfered from 4M to [phone] via wheelchair; alert and oriented x 4;  complaints of headache (patient received pain medicine before he came up on this floor); IV saline locked in RFA. Orient patient to room and unit; tele box 26 placed per MD order; telesitter in patient's room; instructed how to use the call bell and  fall risk precautions. Will continue to monitor the patient.

## 2017-10-04 NOTE — Progress Notes (Signed)
Fluid in application of CIWA protocol and med administration to fit pt need. Increasing agitation and withdrawal s/s in afternoon called for administration of ativan whereas pt did not exhibit s/s that called for scheduled administration earlier.   Spoke with wife at length. She expresses that pt persist in believing that hallucinations and delusions he experienced before admission still appear real to him and he has difficultly believing that the "[place] agents did not wire his home". The wife and sister are anxious for his wellbeing and much desire psychiatric evaluation with possible inpatient treatment.

## 2017-10-04 NOTE — Progress Notes (Signed)
Pt c/o HA, very mild tremor and given Ativan  PO per CIWA protocol. HA improved, but persistant, gave additional  Ativan.  Wife updated on phone, will be visiting this afternoon.

## 2017-10-05 ENCOUNTER — Encounter (HOSPITAL_COMMUNITY): Payer: Self-pay | Admitting: *Deleted

## 2017-10-05 ENCOUNTER — Other Ambulatory Visit: Payer: Self-pay

## 2017-10-05 DIAGNOSIS — R450 Nervousness: Secondary | ICD-10-CM

## 2017-10-05 DIAGNOSIS — F17210 Nicotine dependence, cigarettes, uncomplicated: Secondary | ICD-10-CM

## 2017-10-05 DIAGNOSIS — F419 Anxiety disorder, unspecified: Secondary | ICD-10-CM

## 2017-10-05 LAB — COMPREHENSIVE METABOLIC PANEL
ALT: 53 U/L (ref 17–63)
AST: 39 U/L (ref 15–41)
Albumin: 3.1 g/dL — ABNORMAL LOW (ref 3.5–5.0)
Alkaline Phosphatase: 86 U/L (ref 38–126)
Anion gap: 7 (ref 5–15)
CHLORIDE: 109 mmol/L (ref 101–111)
CO2: 28 mmol/L (ref 22–32)
Calcium: 8.4 mg/dL — ABNORMAL LOW (ref 8.9–10.3)
Creatinine, Ser: 0.68 mg/dL (ref 0.61–1.24)
GFR calc Af Amer: >60 mL/min (ref >60)
GFR calc non Af Amer: >60 mL/min (ref >60)
GLUCOSE: 93 mg/dL (ref 65–99)
POTASSIUM: 3.5 mmol/L (ref 3.5–5.1)
Sodium: 144 mmol/L (ref 135–145)
Total Bilirubin: 0.3 mg/dL (ref 0.3–1.2)
Total Protein: 5.4 g/dL — ABNORMAL LOW (ref 6.5–8.1)

## 2017-10-05 MED ORDER — CLOBETASOL PROPIONATE 0.05 % EX CREA
TOPICAL_CREAM | Freq: Two times a day (BID) | CUTANEOUS | Status: DC
  Administered 2017-10-05 – 2017-10-06 (×3): via TOPICAL
  Filled 2017-10-05 (×2): qty 15

## 2017-10-05 MED ORDER — AVEENO SOOTHING BATH TREATMENT EX PACK
1.0000 | PACK | Freq: Every day | CUTANEOUS | Status: DC
  Filled 2017-10-05: qty 1

## 2017-10-05 MED ORDER — CLOBETASOL PROPIONATE 0.05 % EX CREA: TOPICAL_CREAM | Freq: Two times a day (BID) | CUTANEOUS | Status: DC

## 2017-10-05 MED ORDER — ACETAMINOPHEN 325 MG PO TABS
650.0000 mg | ORAL_TABLET | Freq: Four times a day (QID) | ORAL | Status: DC | PRN
  Administered 2017-10-05: 650 mg via ORAL
  Filled 2017-10-05: qty 2

## 2017-10-05 MED ORDER — AVEENO SOOTHING BATH TREATMENT EX PACK
1.0000 | PACK | Freq: Every day | CUTANEOUS | Status: DC
  Administered 2017-10-05 – 2017-10-06 (×2): 1 via TOPICAL
  Filled 2017-10-05: qty 1

## 2017-10-05 NOTE — Progress Notes (Signed)
PROGRESS NOTE  Terry Burns ZOX:[id number] DOB: 1975-07-14 DOA: 10/03/2017 PCP: Patient, No Pcp Per  HPI/Recap of past 24 hours: 42 yr old male with ETOH abuse and HTN he comes in with hallucinations to the ED. Early in the morning while in the ED he started showing signs of ETOH withdrawal. eICU were called for admission. I was paged by the ED nurse since patient has not been evaluated by ICU and I was not informed about it earlier. I came immediately to assess the patient in ED he was on precedex and was sleepy. I tried to wake him up but he was lethargic and could not take any history from him and no family around except sitter.  Admitted to ICU for severe alcohol withdrawal with hallucinations, delirium tremens.  Transferred to hospitalist service on 10/05/2017.  Patient seen and examined at his bedside.  Reports his mood is good and has no intention of harming himself.  Spoke with the patient's wife on the phone who was concerned about the patient's chronic anxiety and recent hallucinations.  Psychiatry consulted.  Highly appreciated  Assessment/Plan: Active Problems:   Alcohol withdrawal (HCC)   Severe alcohol withdrawal hallucinations, delirium tremens Continue CIWA protocol Patient reports having a good support system at home Advised to check into alcoholic Anonymous and was receptive to it CT head negative for any acute intracranial findings  Poison IV dermatitis  Started Aveeno soothing bath treatment x5 days Started clobetasol cream Tums 5 days  History of chronic anxiety As reported by his wife Psychiatry consulted Denies suicidal or homicidal ideation  Polysubstance abuse including THC, opiates, and alcohol Benefit from substance abuse rehabilitation   Hypertension Blood pressure stable Continue home medications  Chronic normocytic anemia No sign of overt bleeding Hemoglobin 8.5 Baseline hemoglobin 10.0 Repeat CBC in the morning Obtain iron  studies    Code Status: Full code  Family Communication: Wife on the phone  Disposition Plan: Home when clinically stable   Consultants:  Psychiatry  Procedures:  None  Antimicrobials:  None  DVT prophylaxis: SCDs subcu Lovenox 40 daily   Objective: Vitals:   10/04/17 2215 10/05/17 0500 10/05/17 0540 10/05/17 0810  BP: 133/86  119/74 (!) 146/98  Pulse: 72  (!) 54   Resp: 18  18   Temp: 98.3 F (36.8 C)  97.8 F (36.6 C)   TempSrc: Oral     SpO2: 100%  98%   Weight: 69.6 kg (153 lb 8 oz) 69.8 kg (153 lb 12.8 oz)    Height:        Intake/Output Summary (Last 24 hours) at 10/05/2017 1230 Last data filed at 10/05/2017 1028 Gross per 24 hour  Intake 1320 ml  Output 2075 ml  Net -755 ml   Filed Weights   10/04/17 0350 10/04/17 2215 10/05/17 0500  Weight: 66.7 kg (147 lb 0.8 oz) 69.6 kg (153 lb 8 oz) 69.8 kg (153 lb 12.8 oz)    Exam:  . General: 42 y.o. year-old male well developed well nourished in no acute distress.  Alert and oriented x3. . Cardiovascular: Regular rate and rhythm with no rubs or gallops.  No thyromegaly or JVD noted.   Marland Kitchen Respiratory: Clear to auscultation with no wheezes or rales. Good inspiratory effort. . Abdomen: Soft nontender nondistended with normal bowel sounds x4 quadrants. . Musculoskeletal: No lower extremity edema. 2/4 pulses in all 4 extremities. . Skin: Rash on the right ankle from poison ivy dermatitis . Psychiatry: Mood is appropriate for condition  and setting   Data Reviewed: CBC: Recent Labs  Lab 10/03/17 0057 10/03/17 1521 10/04/17 0526  WBC 11.7* 5.8 5.1  NEUTROABS 8.2* 3.4  --   HGB 10.0* 9.0* 8.5*  HCT 30.1* 27.1* 25.9*  MCV 91.2 90.9 91.8  PLT 301 276 272   Basic Metabolic Panel: Recent Labs  Lab 10/03/17 0057 10/03/17 1521 10/04/17 0526 10/05/17 0402  NA 133* 140 141 144  K 3.0* 3.1* 3.5 3.5  CL 98* 110 113* 109  CO2 16* 20* 21* 28  GLUCOSE 94 97 97 93  BUN <5*  CREATININE 1.26* 0.78  0.64 0.68  CALCIUM 9.3 8.1* 7.9* 8.4*   GFR: Estimated Creatinine Clearance: 122.4 mL/min (by C-G formula based on SCr of 0.68 mg/dL). Liver Function Tests: Recent Labs  Lab 10/03/17 0057 10/05/17 0402  AST 82* 39  ALT 87* 53  ALKPHOS 136* 86  BILITOT 2.0* 0.3  PROT 7.5 5.4*  ALBUMIN 4.6 3.1*   No results for input(s): LIPASE, AMYLASE in the last 168 hours. No results for input(s): AMMONIA in the last 168 hours. Coagulation Profile: No results for input(s): INR, PROTIME in the last 168 hours. Cardiac Enzymes: No results for input(s): CKTOTAL, CKMB, CKMBINDEX, TROPONINI in the last 168 hours. BNP (last 3 results) No results for input(s): PROBNP in the last 8760 hours. HbA1C: No results for input(s): HGBA1C in the last 72 hours. CBG: Recent Labs  Lab 10/03/17 1506 10/03/17 2027 10/04/17 0035  GLUCAP 105* 151* 114*   Lipid Profile: No results for input(s): CHOL, HDL, LDLCALC, TRIG, CHOLHDL, LDLDIRECT in the last 72 hours. Thyroid Function Tests: No results for input(s): TSH, T4TOTAL, FREET4, T3FREE, THYROIDAB in the last 72 hours. Anemia Panel: No results for input(s): VITAMINB12, FOLATE, FERRITIN, TIBC, IRON, RETICCTPCT in the last 72 hours. Urine analysis:    Component Value Date/Time   COLORURINE YELLOW 10/03/2017 0055   APPEARANCEUR HAZY (A) 10/03/2017 0055   LABSPEC 1.021 10/03/2017 0055   PHURINE 5.0 10/03/2017 0055   GLUCOSEU NEGATIVE 10/03/2017 0055   HGBUR NEGATIVE 10/03/2017 0055   BILIRUBINUR NEGATIVE 10/03/2017 0055   KETONESUR 20 (A) 10/03/2017 0055   PROTEINUR 30 (A) 10/03/2017 0055   NITRITE NEGATIVE 10/03/2017 0055   LEUKOCYTESUR NEGATIVE 10/03/2017 0055   Sepsis Labs: (procalcitonin:4,lacticidven:4)  ) Recent Results (from the past 240 hour(s))  MRSA PCR Screening     Status: None   Collection Time: 10/03/17  3:52 PM  Result Value Ref Range Status   MRSA by PCR NEGATIVE NEGATIVE Final    Comment:        The GeneXpert MRSA Assay  (FDA approved for NASAL specimens only), is one component of a comprehensive MRSA colonization surveillance program. It is not intended to diagnose MRSA infection nor to guide or monitor treatment for MRSA infections. Performed at [facility], [address], [place], [place] [address]       Studies: No results found.  Scheduled Meds: . amLODipine  5 mg Oral Daily  . AVEENO SOOTHING BATH TREATMENT  1 packet Topical Daily  . clobetasol cream   Topical BID  . enoxaparin (LOVENOX) injection  40 mg Subcutaneous Q24H  . LORazepam  0-4 mg Oral Q6H   Followed by  . [START ON 10/06/2017] LORazepam  0-4 mg Oral Q12H  . losartan  100 mg Oral Daily    Continuous Infusions: . sodium chloride 10 mL/hr at 10/04/17 0500  . banana bag IV 1000 mL 75 mL/hr at  10/04/17 2000     LOS: 2 days     Darlin Drop, MD [facility] Pager [phone]  If 7PM-7AM, please contact night-coverage [email or web address] Password [email or web address] 10/05/2017, 12:30 PM

## 2017-10-05 NOTE — Progress Notes (Signed)
Patient has times of confusion where he asked the same question several times and is not compliant with using his call bell and is re-educated on the use of the call bell and asking for help.  Will continue to monitor.

## 2017-10-05 NOTE — Consult Note (Signed)
[facility] Consult   Reason for Consult:  Alcohol withdrawal with hallucinations Referring Physician:  Medical provider Patient Identification: Terry Burns MRN:  [id number] Principal Diagnosis: Alcohol withdrawal [facility]) Diagnosis:   Patient Active Problem List   Diagnosis Date Noted  . Alcohol withdrawal ([place]) [F10.239] 10/03/2017    Priority: High  . Hyponatremia [E87.1]   . AKI (acute kidney injury) ([place]) [N17.9]   . Hypokalemia [E87.6]     Total Time spent with patient: 45 minutes  Subjective:   Terry Burns is a 42 y.o. male patient does not warrant admission.  Medication recommendations:  Restart his Zoloft 150 mg daily for depression and anxiety, discontinue his Klonopin PRN anxiety, start gabapentin 300 mg TID for withdrawal symptoms and anxiety.  Continue Ativan alcohol detox protocol while hospitalized.  Patient is not interested in rehab, encouraged AA.  Dr. Dwyane Dee, psychiatrist, has reviewed this patient and concurs with the treatment plan/recommendations.  HPI:  42 yo male who presented to the hospital with paranoia and hallucinations after stopping his alcohol and benzodiazapine use.  Denies current paranoia and hallucinations along with suicidal/homicidal ideations and withdrawal symptoms besides having a low level of anxiety.  Drinking alcohol daily for 15 years.  Educated him on the use of benzodiazepine and the negative additive effects with alcohol, dependence issues along with withdrawal concerns.  He is not interested in rehab or recovery, encouraged AA and obtaining a sponsor.  Reports his family and wife are good support systems.  Plans to refrain from alcohol use, encouraged him to have his family remove the alcohol and Klonopin from the home prior to discharge.  Discussed the dangers of withdrawing from alcohol and benzodiazepines.  Psychiatrically stable  Past Psychiatric History: alcohol dependence  Risk to Self: Suicidal Ideation: No Suicidal  Intent: No Is patient at risk for suicide?: No Suicidal Plan?: No Access to Means: No What has been your use of drugs/alcohol within the last 12 months?: Pt reports using alcohol and marijuana How many times?: 0 Other Self Harm Risks: None Triggers for Past Attempts: None known Intentional Self Injurious Behavior: None Risk to Others: Homicidal Ideation: No-Not Currently/Within Last 6 Months Thoughts of Harm to Others: No Current Homicidal Intent: No Current Homicidal Plan: No Access to Homicidal Means: No Identified Victim: None History of harm to others?: No Assessment of Violence: None Noted Violent Behavior Description: Pt denies history of violence Does patient have access to weapons?: No Criminal Charges Pending?: No Does patient have a court date: No Prior Inpatient Therapy: Prior Inpatient Therapy: No Prior Outpatient Therapy: Prior Outpatient Therapy: Yes Prior Therapy Dates: Current Prior Therapy Facilty/Provider(s): [facility] Reason for Treatment: Pt doesn't know Does patient have an ACCT team?: No Does patient have Intensive In-House Services?  : No Does patient have [facility] services? : No Does patient have P4CC services?: No  Past Medical History:  Past Medical History:  Diagnosis Date  . Hypertension    History reviewed. No pertinent surgical history. Family History: No family history on file. Family Psychiatric  History: none Social History:  Social History   Substance and Sexual Activity  Alcohol Use Yes     Social History   Substance and Sexual Activity  Drug Use Yes    Social History   Socioeconomic History  . Marital status: Single    Spouse name: Not on file  . Number of children: Not on file  . Years of education: Not on file  . Highest education level: Not on file  Occupational History  . Not on file  Social Needs  . Financial resource strain: Not on file  . Food insecurity:    Worry: Not on file    Inability: Not on file  .  Transportation needs:    Medical: Not on file    Non-medical: Not on file  Tobacco Use  . Smoking status: Current Every Day Smoker  . Smokeless tobacco: Never Used  Substance and Sexual Activity  . Alcohol use: Yes  . Drug use: Yes  . Sexual activity: Yes  Lifestyle  . Physical activity:    Days per week: Not on file    Minutes per session: Not on file  . Stress: Not on file  Relationships  . Social connections:    Talks on phone: Not on file    Gets together: Not on file    Attends religious service: Not on file    Active member of club or organization: Not on file    Attends meetings of clubs or organizations: Not on file    Relationship status: Not on file  Other Topics Concern  . Not on file  Social History Narrative  . Not on file   Additional Social History:    Allergies:   Allergies  Allergen Reactions  . Augmentin [Amoxicillin-Pot Clavulanate] Nausea And Vomiting  . Ciprofloxacin Other (See Comments)    Labs:  Results for orders placed or performed during the hospital encounter of 10/03/17 (from the past 48 hour(s))  Glucose, capillary     Status: Abnormal   Collection Time: 10/03/17  3:06 PM  Result Value Ref Range   Glucose-Capillary 105 (H) 65 - 99 mg/dL   Comment 1 Capillary Specimen    Comment 2 Notify RN   HIV antibody (Routine Testing)     Status: None   Collection Time: 10/03/17  3:21 PM  Result Value Ref Range   HIV Screen 4th Generation wRfx Non Reactive Non Reactive    Comment: (NOTE) Performed At: [facility] [address] [place], [place] [id number] Rush Farmer MD PJ:[id number] Performed at [facility], [place] [address], [place], [place] [address]   CBC with Differential/Platelet     Status: Abnormal   Collection Time: 10/03/17  3:21 PM  Result Value Ref Range   WBC 5.8 4.0 - 10.5 K/uL   RBC 2.98 (L) 4.22 - 5.81 MIL/uL   Hemoglobin 9.0 (L) 13.0 - 17.0 g/dL   HCT 27.1 (L) 39.0 - 52.0 %   MCV 90.9 78.0 - 100.0 fL    MCH 30.2 26.0 - 34.0 pg   MCHC 33.2 30.0 - 36.0 g/dL   RDW 15.1 11.5 - 15.5 %   Platelets 276 150 - 400 K/uL   Neutrophils Relative % 59 %   Neutro Abs 3.4 1.7 - 7.7 K/uL   Lymphocytes Relative 28 %   Lymphs Abs 1.6 0.7 - 4.0 K/uL   Monocytes Relative 12 %   Monocytes Absolute 0.7 0.1 - 1.0 K/uL   Eosinophils Relative 1 %   Eosinophils Absolute 0.1 0.0 - 0.7 K/uL   Basophils Relative 0 %   Basophils Absolute 0.0 0.0 - 0.1 K/uL    Comment: Performed at [facility] [address], [place], [place] [address]  Basic metabolic panel     Status: Abnormal   Collection Time: 10/03/17  3:21 PM  Result Value Ref Range   Sodium 140 135 - 145 mmol/L   Potassium 3.1 (L) 3.5 - 5.1  mmol/L   Chloride 110 101 - 111 mmol/L   CO2 20 (L) 22 - 32 mmol/L   Glucose, Bld 97 65 - 99 mg/dL   BUN 10 6 - 20 mg/dL   Creatinine, Ser 0.78 0.61 - 1.24 mg/dL   Calcium 8.1 (L) 8.9 - 10.3 mg/dL   GFR calc non Af Amer >60 >60 mL/min   GFR calc Af Amer >60 >60 mL/min    Comment: (NOTE) The eGFR has been calculated using the CKD EPI equation. This calculation has not been validated in all clinical situations. eGFR's persistently <60 mL/min signify possible Chronic Kidney Disease.    Anion gap 10 5 - 15    Comment: Performed at [facility] [address] [place], [place] [address]  Lactic acid, plasma     Status: None   Collection Time: 10/03/17  3:21 PM  Result Value Ref Range   Lactic Acid, Venous 0.5 0.5 - 1.9 mmol/L    Comment: Performed at [facility] [address], [place], [place] [address]  MRSA PCR Screening     Status: None   Collection Time: 10/03/17  3:52 PM  Result Value Ref Range   MRSA by PCR NEGATIVE NEGATIVE    Comment:        The GeneXpert MRSA Assay (FDA approved for NASAL specimens only), is one component of a comprehensive MRSA colonization surveillance program. It is not intended to diagnose MRSA infection nor to guide or monitor treatment for MRSA  infections. Performed at [facility], [place] [address], [place], [place] [address]   Glucose, capillary     Status: Abnormal   Collection Time: 10/03/17  8:27 PM  Result Value Ref Range   Glucose-Capillary 151 (H) 65 - 99 mg/dL  Glucose, capillary     Status: Abnormal   Collection Time: 10/04/17 12:35 AM  Result Value Ref Range   Glucose-Capillary 114 (H) 65 - 99 mg/dL   Comment 1 Notify RN   CBC     Status: Abnormal   Collection Time: 10/04/17  5:26 AM  Result Value Ref Range   WBC 5.1 4.0 - 10.5 K/uL   RBC 2.82 (L) 4.22 - 5.81 MIL/uL   Hemoglobin 8.5 (L) 13.0 - 17.0 g/dL   HCT 25.9 (L) 39.0 - 52.0 %   MCV 91.8 78.0 - 100.0 fL   MCH 30.1 26.0 - 34.0 pg   MCHC 32.8 30.0 - 36.0 g/dL   RDW 15.2 11.5 - 15.5 %   Platelets 272 150 - 400 K/uL    Comment: Performed at [facility], [place] [address], [place], [place] [address]  Basic metabolic panel     Status: Abnormal   Collection Time: 10/04/17  5:26 AM  Result Value Ref Range   Sodium 141 135 - 145 mmol/L   Potassium 3.5 3.5 - 5.1 mmol/L   Chloride 113 (H) 101 - 111 mmol/L   CO2 21 (L) 22 - 32 mmol/L   Glucose, Bld 97 65 - 99 mg/dL   BUN 7 6 - 20 mg/dL   Creatinine, Ser 0.64 0.61 - 1.24 mg/dL   Calcium 7.9 (L) 8.9 - 10.3 mg/dL   GFR calc non Af Amer >60 >60 mL/min   GFR calc Af Amer >60 >60 mL/min    Comment: (NOTE) The eGFR has been calculated using the CKD EPI equation. This calculation has not been validated in all clinical situations. eGFR's persistently <60 mL/min signify possible Chronic Kidney  Disease.    Anion gap 7 5 - 15    Comment: Performed at [facility] [address] [place], [place] [address]  Comprehensive metabolic panel     Status: Abnormal   Collection Time: 10/05/17  4:02 AM  Result Value Ref Range   Sodium 144 135 - 145 mmol/L   Potassium 3.5 3.5 - 5.1 mmol/L   Chloride 109 101 - 111 mmol/L   CO2 28 22 - 32 mmol/L   Glucose, Bld 93 65 - 99 mg/dL   BUN <5 (L) 6 - 20 mg/dL    Creatinine, Ser 0.68 0.61 - 1.24 mg/dL   Calcium 8.4 (L) 8.9 - 10.3 mg/dL   Total Protein 5.4 (L) 6.5 - 8.1 g/dL   Albumin 3.1 (L) 3.5 - 5.0 g/dL   AST 39 15 - 41 U/L   ALT 53 17 - 63 U/L   Alkaline Phosphatase 86 38 - 126 U/L   Total Bilirubin 0.3 0.3 - 1.2 mg/dL   GFR calc non Af Amer >60 >60 mL/min   GFR calc Af Amer >60 >60 mL/min    Comment: (NOTE) The eGFR has been calculated using the CKD EPI equation. This calculation has not been validated in all clinical situations. eGFR's persistently <60 mL/min signify possible Chronic Kidney Disease.    Anion gap 7 5 - 15    Comment: Performed at [facility] [address] [place], [place] [address]    Current Facility-Administered Medications  Medication Dose Route Frequency Provider Last Rate Last Dose  . 0.9 %  sodium chloride infusion  250 mL Intravenous PRN Juanito Doom, MD 10 mL/hr at 10/04/17 0500    . acetaminophen (TYLENOL) tablet 650 mg  650 mg Oral Q6H PRN Irene Pap N, DO   650 mg at 10/05/17 0810  . amLODipine (NORVASC) tablet 5 mg  5 mg Oral Daily Simonne Maffucci B, MD   5 mg at 10/05/17 1779  . AVEENO SOOTHING BATH TREATMENT 1 packet  1 packet Topical Daily Kayleen Memos, DO   1 packet at 10/05/17 1134  . butalbital-acetaminophen-caffeine (FIORICET, ESGIC) 50-325-40 MG per tablet 1 tablet  1 tablet Oral Q4H PRN Juanito Doom, MD   1 tablet at 10/05/17 1204  . calamine lotion   Topical PRN Juanito Doom, MD      . clobetasol cream (TEMOVATE) 0.05 %   Topical BID Hall, Carole N, DO      . dicyclomine (BENTYL) capsule 10 mg  10 mg Oral TID PRN Simonne Maffucci B, MD      . diphenhydrAMINE (BENADRYL) capsule 25 mg  25 mg Oral Q6H PRN Juanito Doom, MD   25 mg at 10/04/17 1217  . enoxaparin (LOVENOX) injection 40 mg  40 mg Subcutaneous Q24H Simonne Maffucci B, MD   40 mg at 10/05/17 1205  . LORazepam (ATIVAN) tablet 1 mg  1 mg Oral Q6H PRN Juanito Doom, MD   1 mg at 10/04/17 1344   Or  .  LORazepam (ATIVAN) injection 1 mg  1 mg Intravenous Q6H PRN Simonne Maffucci B, MD      . LORazepam (ATIVAN) tablet 0-4 mg  0-4 mg Oral Q6H Simonne Maffucci B, MD   2 mg at 10/04/17 1820   Followed by  . [START ON 10/06/2017] LORazepam (ATIVAN) tablet 0-4 mg  0-4 mg Oral Q12H McQuaid, Douglas B, MD      . losartan (COZAAR) tablet 100 mg  100 mg Oral Daily Simonne Maffucci B, MD   100 mg at 10/05/17 0810  . ondansetron (ZOFRAN) tablet 4 mg  4 mg Oral Q8H PRN Juanito Doom, MD   4 mg at 10/04/17 1217  . sodium chloride 0.9 % 1,000 mL with thiamine 741 mg, folic acid 1 mg, multivitamins adult 10 mL infusion   Intravenous Continuous Juanito Doom, MD 75 mL/hr at 10/04/17 2000    . traZODone (DESYREL) tablet 100 mg  100 mg Oral QHS PRN Juanito Doom, MD   100 mg at 10/04/17 2224    Musculoskeletal: Strength & Muscle Tone: within normal limits Gait & Station: normal Patient leans: N/A  Psychiatric Specialty Exam: Physical Exam  Constitutional: He is oriented to person, place, and time. He appears well-developed and well-nourished.  HENT:  Head: Normocephalic.  Neck: Normal range of motion.  Respiratory: Effort normal.  Musculoskeletal: Normal range of motion.  Neurological: He is alert and oriented to person, place, and time.  Psychiatric: His speech is normal and behavior is normal. Judgment and thought content normal. His mood appears anxious. Cognition and memory are normal.    Review of Systems  Psychiatric/Behavioral: Positive for substance abuse. The patient is nervous/anxious.   All other systems reviewed and are negative.   Blood pressure (!) 146/98, pulse (!) 54, temperature 97.8 F (36.6 C), resp. rate 18, height _0  (1.854 m), weight 69.8 kg (153 lb 12.8 oz), SpO2 98 %.Body mass index is 20.29 kg/m.  General Appearance: Casual  Eye Contact:  Good  Speech:  Normal Rate  Volume:  Normal  Mood:  Anxious, mild  Affect:  Congruent  Thought Process:  Coherent and  Descriptions of Associations: Intact  Orientation:  Full (Time, Place, and Person)  Thought Content:  WDL and Logical  Suicidal Thoughts:  No  Homicidal Thoughts:  No  Memory:  Immediate;   Good Recent;   Good Remote;   Good  Judgement:  Fair  Insight:  Fair  Psychomotor Activity:  Normal  Concentration:  Concentration: Good and Attention Span: Good  Recall:  Good  Fund of Knowledge:  Fair  Language:  Good  Akathisia:  No  Handed:  Right  AIMS (if indicated):     Assets:  Housing Intimacy Leisure Time Physical Health Resilience Social Support  ADL's:  Intact  Cognition:  WNL  Sleep:        Treatment Plan Summary: Alcohol dependence: -Continue Ativan detox protocol during hospitalization -Start gabapentin 300 mg TID for anxiety and withdrawal symptom  Depression: -Restart his Zoloft 150 mg daily for depression  Anxiety: -Do not restart his Klonopin -Start gabapentin 300 mg TID for anxiety and withdrawal symptoms  Disposition: No evidence of imminent risk to self or others at present.    Waylan Boga, NP 10/05/2017 1:49 PM

## 2017-10-06 ENCOUNTER — Telehealth (HOSPITAL_COMMUNITY): Payer: Self-pay | Admitting: Psychology

## 2017-10-06 DIAGNOSIS — F22 Delusional disorders: Secondary | ICD-10-CM

## 2017-10-06 DIAGNOSIS — R440 Auditory hallucinations: Secondary | ICD-10-CM

## 2017-10-06 DIAGNOSIS — R441 Visual hallucinations: Secondary | ICD-10-CM

## 2017-10-06 LAB — CBC
HCT: 29 % — ABNORMAL LOW (ref 39.0–52.0)
Hemoglobin: 9.5 g/dL — ABNORMAL LOW (ref 13.0–17.0)
MCH: 30.3 pg (ref 26.0–34.0)
MCHC: 32.8 g/dL (ref 30.0–36.0)
MCV: 92.4 fL (ref 78.0–100.0)
Platelets: 366 10*3/uL (ref 150–400)
RBC: 3.14 MIL/uL — AB (ref 4.22–5.81)
RDW: 15.7 % — ABNORMAL HIGH (ref 11.5–15.5)
WBC: 4.5 10*3/uL (ref 4.0–10.5)

## 2017-10-06 LAB — RETICULOCYTES
RBC.: 3.14 MIL/uL — ABNORMAL LOW (ref 4.22–5.81)
RETIC COUNT ABSOLUTE: 125.6 10*3/uL (ref 19.0–186.0)
Retic Ct Pct: 4 % — ABNORMAL HIGH (ref 0.4–3.1)

## 2017-10-06 LAB — IRON AND TIBC
IRON: 53 ug/dL (ref 45–182)
SATURATION RATIOS: 17 % — AB (ref 17.9–39.5)
TIBC: 316 ug/dL (ref 250–450)
UIBC: 263 ug/dL

## 2017-10-06 LAB — FERRITIN: FERRITIN: 164 ng/mL (ref 24–336)

## 2017-10-06 LAB — VITAMIN B12: Vitamin B-12: 385 pg/mL (ref 180–914)

## 2017-10-06 MED ORDER — GABAPENTIN 250 MG/5ML PO SOLN
300.0000 mg | Freq: Three times a day (TID) | ORAL | Status: DC
  Filled 2017-10-06: qty 6

## 2017-10-06 MED ORDER — CLOBETASOL PROPIONATE 0.05 % EX CREA: TOPICAL_CREAM | Freq: Two times a day (BID) | CUTANEOUS | 0 refills | Status: DC

## 2017-10-06 MED ORDER — AVEENO SOOTHING BATH TREATMENT EX PACK: 1.0000 | PACK | Freq: Every day | CUTANEOUS | 0 refills | Status: AC

## 2017-10-06 MED ORDER — SERTRALINE HCL 50 MG PO TABS
150.0000 mg | ORAL_TABLET | Freq: Every day | ORAL | Status: DC
  Administered 2017-10-06: 150 mg via ORAL
  Filled 2017-10-06: qty 1

## 2017-10-06 MED ORDER — FERROUS SULFATE 325 (65 FE) MG PO TABS
325.0000 mg | ORAL_TABLET | Freq: Every day | ORAL | Status: DC
  Administered 2017-10-06: 325 mg via ORAL
  Filled 2017-10-06: qty 1

## 2017-10-06 MED ORDER — FERROUS SULFATE 325 (65 FE) MG PO TABS: 325.0000 mg | ORAL_TABLET | Freq: Every day | ORAL | 0 refills | Status: AC

## 2017-10-06 MED ORDER — SERTRALINE HCL 50 MG PO TABS: 150.0000 mg | ORAL_TABLET | Freq: Every day | ORAL | 0 refills | Status: AC

## 2017-10-06 MED ORDER — CLOBETASOL PROPIONATE 0.05 % EX CREA: TOPICAL_CREAM | Freq: Two times a day (BID) | CUTANEOUS | 0 refills | Status: AC

## 2017-10-06 MED ORDER — GABAPENTIN 300 MG PO CAPS
300.0000 mg | ORAL_CAPSULE | Freq: Three times a day (TID) | ORAL | Status: DC
  Administered 2017-10-06: 300 mg via ORAL
  Filled 2017-10-06: qty 1

## 2017-10-06 MED ORDER — GABAPENTIN 300 MG PO CAPS: 300.0000 mg | ORAL_CAPSULE | Freq: Three times a day (TID) | ORAL | 0 refills | Status: AC

## 2017-10-06 NOTE — Progress Notes (Signed)
CSW received consult regarding substance use resources. CSW spoke with patient and his sister at bedside with his consent. Patient reported that he has been drinking too much alcohol in order to deal with childhood trauma. Patient's sister has flown in from [place] to be with patient. She is very tearful and wants patient to get help. CSW provided mental health counseling and substance use resources. Patient does not have insurance and prefers outpatient treatment.   CSW signing off.  Osborne Casco Rayan Dyal LCSW [phone]

## 2017-10-06 NOTE — Discharge Summary (Addendum)
Discharge Summary  Terry Burns Terry Burns DOB: [birth year]  PCP: Patient, No Pcp Per  Admit date: 10/03/2017 Discharge date: 10/06/2017  Time spent: 25 minutes  Recommendations for Outpatient Follow-up:  1. Follow up with psychiatry 2. Follow up with PCP 3. Please abstain from alcohol use and polysubstance use 4. Please take your medications as prescribed  Discharge Diagnoses:  Active Hospital Problems   Diagnosis Date Noted  . Alcohol withdrawal (HCC) 10/03/2017    Resolved Hospital Problems  No resolved problems to display.    Discharge Condition: Stable   Diet recommendation: Resume previous diet  Vitals:   10/06/17 0512 10/06/17 1510  BP: 120/74 (!) 154/98  Pulse: 64 77  Resp: 16 16  Temp:  99.2 F (37.3 C)  SpO2: 100% 100%    History of present illness:  42 yr old male with ETOH abuse and HTN he comes in with hallucinations to the ED. Early in the morning while in the ED he started showing signs of ETOH withdrawal. eICU were called for admission. I was paged by the ED nurse since patient has not been evaluated by ICU and I was not informed about it earlier. I came immediately to assess the patient in ED he was on precedex and was sleepy. I tried to wake him up but he was lethargic and could not take any history from him and no family around except sitter.  Admitted to ICU for severe alcohol withdrawal with hallucinations, delirium tremens.  Transferred to hospitalist service on 10/05/2017.  Patient seen and examined at his bedside.  Reports his mood is good and has no intention of harming himself.  Spoke with the patient's wife on the phone who was concerned about the patient's chronic anxiety and recent hallucinations.  Psychiatry consulted.  Highly appreciated.  10/06/17: Had IV ativan  for CIWA >8 this morning. Has no new complaints. Asked to speak to psych. Denies SI and HI.  On the day of discharge, the patient was hemodynamically stable. He will  need to follow up with psychiatry and hopefully also with Alcohol Anonymous.  Hospital Course:  Principal Problem:   Alcohol withdrawal (HCC)  Severe alcohol withdrawal with hallucination, delirium tremens Improving  Received CIWA protocol in the hospital Patient reports having a good support system at home-has a wife and a sister.  No children. Advised to check into alcoholic Anonymous and was receptive to it CT head negative for any acute intracranial findings  Poison ivy dermatitis, improving Continue current management Aveeno soothing bath treatment x5 days clobetasol cream x 5 days  Chronic anxiety/depression Continue Zoloft Continue gabapentin Follow up with Psych outpatient Denies suicidal ideation or homicidal ideation  Polysubstance abuse including THC, opiates and alcohol  Will benefit from substance abuse rehab   Hypertension, stable Continue home medications Follow up with PCP outpatient  Chronic normocytic anemia-iron deficiency anemia Transat 17, iron 53, ferritin 164 Start ferrous sulfate, continue  Hemoglobin stable at 9.5 from 8.5 No sign of overt bleeding     Procedures:  None  Consultations:  Psychiatry  Discharge Exam: BP (!) 154/98 (BP Location: Right Arm)   Pulse 77   Temp 99.2 F (37.3 C) (Oral)   Resp 16   Ht  (1.854 m)   Wt 70 kg (154 lb 5.2 oz)   SpO2 100%   BMI 20.36 kg/m    . General: 42 y.o. year-old male well developed well nourished in no acute distress.  Alert and oriented x3. . Cardiovascular: Regular rate  and rhythm with no rubs or gallops.  No thyromegaly or JVD noted.   Marland Kitchen Respiratory: Clear to auscultation with no wheezes or rales. Good inspiratory effort. . Abdomen: Soft nontender nondistended with normal bowel sounds x4 quadrants. . Musculoskeletal: No lower extremity edema. 2/4 pulses in all 4 extremities. . Skin: No ulcerative lesions noted or rashes, . Psychiatry: Mood is appropriate for condition and  setting  Discharge Instructions You were cared for by a hospitalist during your hospital stay. If you have any questions about your discharge medications or the care you received while you were in the hospital after you are discharged, you can call the unit and asked to speak with the hospitalist on call if the hospitalist that took care of you is not available. Once you are discharged, your primary care physician will handle any further medical issues. Please note that NO REFILLS for any discharge medications will be authorized once you are discharged, as it is imperative that you return to your primary care physician (or establish a relationship with a primary care physician if you do not have one) for your aftercare needs so that they can reassess your need for medications and monitor your lab values.   Allergies as of 10/06/2017      Reactions   Augmentin [amoxicillin-pot Clavulanate] Nausea And Vomiting   Ciprofloxacin Other (See Comments)      Medication List    STOP taking these medications   dicyclomine 10 MG capsule Commonly known as:  BENTYL   ondansetron 4 MG tablet Commonly known as:  ZOFRAN     TAKE these medications   amLODipine 5 MG tablet Commonly known as:  NORVASC Take 5 mg by mouth daily.   AVEENO SOOTHING BATH TREATMENT Pack Apply 1 packet topically daily. Start taking on:  10/07/2017   clobetasol cream 0.05 % Commonly known as:  TEMOVATE Apply topically 2 (two) times daily.   ferrous sulfate 325 (65 FE) MG tablet Take 1 tablet (325 mg total) by mouth daily with breakfast. Start taking on:  10/07/2017   gabapentin 300 MG capsule Commonly known as:  NEURONTIN Take 1 capsule (300 mg total) by mouth 3 (three) times daily.   losartan 100 MG tablet Commonly known as:  COZAAR Take 100 mg by mouth daily.   sertraline 50 MG tablet Commonly known as:  ZOLOFT Take 3 tablets (150 mg total) by mouth daily. Start taking on:  10/07/2017      Allergies  Allergen  Reactions  . Augmentin [Amoxicillin-Pot Clavulanate] Nausea And Vomiting  . Ciprofloxacin Other (See Comments)   Follow-up Information    [facility]. Call.   Specialty:  Behavioral Health Why:  Call to see if you need an appointment Contact information: [address] [place] [address] [phone]        [facility], [facility]. Call.   Specialty:  Behavioral Health Why:  Call to see if you can make an appointment. Contact information: [address] [place] [address] [phone]        [facility] [employer]. Call.   Specialty:  Professional Counselor Why:  Call to see if you can walk in for first appointment Contact information: [facility] [address] [place] [address] [phone]        Inc, [address] [facility]. Call.   Why:  Call for an appointment Contact information: [address] [place] [address] [phone]  The results of significant diagnostics from this hospitalization (including imaging, microbiology, ancillary and laboratory) are listed below for reference.    Significant Diagnostic Studies: Ct Head Wo Contrast  Result Date: 10/03/2017 CLINICAL DATA:  42 year old male with hallucination. EXAM: CT HEAD WITHOUT CONTRAST TECHNIQUE: Contiguous axial images were obtained from the base of the skull through the vertex without intravenous contrast. COMPARISON:  None. FINDINGS: Brain: The ventricles and sulci appropriate size for patient's age. The gray-white matter discrimination is preserved. There is no acute intracranial hemorrhage. No mass effect or midline shift. Mild prominence of the CSF space in the posterior fossa may represent an arachnoid cyst or mildly dilated cisterna magna. Vascular: No hyperdense vessel or unexpected calcification. Skull: Normal. Negative for fracture or focal lesion. Sinuses/Orbits: No acute finding. Other: None  IMPRESSION: No acute intracranial pathology. Electronically Signed   By: Elgie Collard M.D.   On: 10/03/2017 01:52    Microbiology: Recent Results (from the past 240 hour(s))  MRSA PCR Screening     Status: None   Collection Time: 10/03/17  3:52 PM  Result Value Ref Range Status   MRSA by PCR NEGATIVE NEGATIVE Final    Comment:        The GeneXpert MRSA Assay (FDA approved for NASAL specimens only), is one component of a comprehensive MRSA colonization surveillance program. It is not intended to diagnose MRSA infection nor to guide or monitor treatment for MRSA infections. Performed at [facility], [address], [place], [place] [address]      Labs: Basic Metabolic Panel: Recent Labs  Lab 10/03/17 0057 10/03/17 1521 10/04/17 0526 10/05/17 0402  NA 133* 140 141 144  K 3.0* 3.1* 3.5 3.5  CL 98* 110 113* 109  CO2 16* 20* 21* 28  GLUCOSE 94 97 97 93  BUN <5*  CREATININE 1.26* 0.78 0.64 0.68  CALCIUM 9.3 8.1* 7.9* 8.4*   Liver Function Tests: Recent Labs  Lab 10/03/17 0057 10/05/17 0402  AST 82* 39  ALT 87* 53  ALKPHOS 136* 86  BILITOT 2.0* 0.3  PROT 7.5 5.4*  ALBUMIN 4.6 3.1*   No results for input(s): LIPASE, AMYLASE in the last 168 hours. No results for input(s): AMMONIA in the last 168 hours. CBC: Recent Labs  Lab 10/03/17 0057 10/03/17 1521 10/04/17 0526 10/06/17 0540  WBC 11.7* 5.8 5.1 4.5  NEUTROABS 8.2* 3.4  --   --   HGB 10.0* 9.0* 8.5* 9.5*  HCT 30.1* 27.1* 25.9* 29.0*  MCV 91.2 90.9 91.8 92.4  PLT 301 276 272 366   Cardiac Enzymes: No results for input(s): CKTOTAL, CKMB, CKMBINDEX, TROPONINI in the last 168 hours. BNP: BNP (last 3 results) No results for input(s): BNP in the last 8760 hours.  ProBNP (last 3 results) No results for input(s): PROBNP in the last 8760 hours.  CBG: Recent Labs  Lab 10/03/17 1506 10/03/17 2027 10/04/17 0035  GLUCAP 105* 151* 114*       Signed:  Darlin Drop, MD [facility] 10/06/2017, 4:13 PM

## 2017-10-06 NOTE — Progress Notes (Signed)
PROGRESS NOTE  Terry Burns RUE:[id number] DOB: 12-09-1975 DOA: 10/03/2017 PCP: Terry Burns, No Pcp Per  HPI/Recap of past 24 hours: 42 yr old male with ETOH abuse and HTN he comes in with hallucinations to the ED. Early in the morning while in the ED he started showing signs of ETOH withdrawal. eICU were called for admission. I was paged by the ED nurse since Terry Burns has not been evaluated by ICU and I was not informed about it earlier. I came immediately to assess the Terry Burns in ED he was on precedex and was sleepy. I tried to wake him up but he was lethargic and could not take any history from him and no family around except sitter.  Admitted to ICU for severe alcohol withdrawal with hallucinations, delirium tremens.  Transferred to hospitalist service on 10/05/2017.  Terry Burns seen and examined at his bedside.  Reports his mood is good and has no intention of harming himself.  Spoke with the Terry Burns's wife on the phone who was concerned about the Terry Burns's chronic anxiety and recent hallucinations.  Psychiatry consulted.  Highly appreciated.  10/06/17: Had IV ativan  for CIWA >8 this morning. Has no new complaints. Asked to speak to psych. Denies SI and HI.  Assessment/Plan: Principal Problem:   Alcohol withdrawal (HCC)   Severe alcohol withdrawal with hallucination, delirium tremens Improving  Continue CIWA protocol Received 1 mg of IV Ativan this morning for CIWA greater than 8  Terry Burns reports having a good support system at home-has a wife.  No children. Advised to check into alcoholic Anonymous and was receptive to it CT head negative for any acute intracranial findings  Poison ivy dermatitis, improving Continue current management Aveeno soothing bath treatment x5 days clobetasol cream x 5 days  Chronic anxiety/depression Resume Zoloft Continue gabapentin Psych following Denies suicidal ideation or homicidal ideation  Polysubstance abuse including THC, opiates and alcohol    Will benefit from substance abuse rehab   Hypertension, stable Continue home medications  Chronic normocytic anemia-iron deficiency anemia Transat 17, iron 53, ferritin 164 Start ferrous sulfate Hemoglobin stable at 9.5 from 8.5 No sign of overt bleeding   Code Status: Full code  Family Communication: Wife on the phone 10/05/2017  Disposition Plan: Home when clinically stable.  Still requires treatment for alcohol withdrawal.  Possible discharge tomorrow on 10/07/2017.   Consultants:  Psychiatry  Procedures:  None  Antimicrobials:  None  DVT prophylaxis: SCDs subcu Lovenox 40 daily   Objective: Vitals:   10/05/17 2108 10/05/17 2108 10/06/17 0500 10/06/17 0512  BP:  (!) 132/95  120/74  Pulse:  67  64  Resp:  16  16  Temp: 97.9 F (36.6 C)  97.8 F (36.6 C)   TempSrc: Oral  Oral   SpO2:  99%  100%  Weight:   70 kg (154 lb 5.2 oz)   Height:        Intake/Output Summary (Last 24 hours) at 10/06/2017 1000 Last data filed at 10/05/2017 1028 Gross per 24 hour  Intake 660 ml  Output -  Net 660 ml   Filed Weights   10/04/17 2215 10/05/17 0500 10/06/17 0500  Weight: 69.6 kg (153 lb 8 oz) 69.8 kg (153 lb 12.8 oz) 70 kg (154 lb 5.2 oz)    Exam:  . General: 42 y.o. year-old male well-developed well-nourished in no acute distress.  Alert and oriented x3.   . Cardiovascular: Regular rate and rhythm with no rubs or gallops.  No thyromegaly or JVD noted.   Marland Kitchen  Respiratory: Clear to auscultation with no wheezes or rales. Good inspiratory effort. . Abdomen: Soft nontender nondistended with normal bowel sounds x4 quadrants. . Musculoskeletal: No lower extremity edema. 2/4 pulses in all 4 extremities. . Skin: Rash on the right ankle from poison ivy dermatitis . Psychiatry: Mood is appropriate for condition and setting   Data Reviewed: CBC: Recent Labs  Lab 10/03/17 0057 10/03/17 1521 10/04/17 0526 10/06/17 0540  WBC 11.7* 5.8 5.1 4.5  NEUTROABS 8.2* 3.4  --   --    HGB 10.0* 9.0* 8.5* 9.5*  HCT 30.1* 27.1* 25.9* 29.0*  MCV 91.2 90.9 91.8 92.4  PLT 301 276 272 366   Basic Metabolic Panel: Recent Labs  Lab 10/03/17 0057 10/03/17 1521 10/04/17 0526 10/05/17 0402  NA 133* 140 141 144  K 3.0* 3.1* 3.5 3.5  CL 98* 110 113* 109  CO2 16* 20* 21* 28  GLUCOSE 94 97 97 93  BUN <5*  CREATININE 1.26* 0.78 0.64 0.68  CALCIUM 9.3 8.1* 7.9* 8.4*   GFR: Estimated Creatinine Clearance: 122.7 mL/min (by C-G formula based on SCr of 0.68 mg/dL). Liver Function Tests: Recent Labs  Lab 10/03/17 0057 10/05/17 0402  AST 82* 39  ALT 87* 53  ALKPHOS 136* 86  BILITOT 2.0* 0.3  PROT 7.5 5.4*  ALBUMIN 4.6 3.1*   No results for input(s): LIPASE, AMYLASE in the last 168 hours. No results for input(s): AMMONIA in the last 168 hours. Coagulation Profile: No results for input(s): INR, PROTIME in the last 168 hours. Cardiac Enzymes: No results for input(s): CKTOTAL, CKMB, CKMBINDEX, TROPONINI in the last 168 hours. BNP (last 3 results) No results for input(s): PROBNP in the last 8760 hours. HbA1C: No results for input(s): HGBA1C in the last 72 hours. CBG: Recent Labs  Lab 10/03/17 1506 10/03/17 2027 10/04/17 0035  GLUCAP 105* 151* 114*   Lipid Profile: No results for input(s): CHOL, HDL, LDLCALC, TRIG, CHOLHDL, LDLDIRECT in the last 72 hours. Thyroid Function Tests: No results for input(s): TSH, T4TOTAL, FREET4, T3FREE, THYROIDAB in the last 72 hours. Anemia Panel: Recent Labs    10/06/17 0540  VITAMINB12 385  FERRITIN 164  TIBC 316  IRON 53  RETICCTPCT 4.0*   Urine analysis:    Component Value Date/Time   COLORURINE YELLOW 10/03/2017 0055   APPEARANCEUR HAZY (A) 10/03/2017 0055   LABSPEC 1.021 10/03/2017 0055   PHURINE 5.0 10/03/2017 0055   GLUCOSEU NEGATIVE 10/03/2017 0055   HGBUR NEGATIVE 10/03/2017 0055   BILIRUBINUR NEGATIVE 10/03/2017 0055   KETONESUR 20 (A) 10/03/2017 0055   PROTEINUR 30 (A) 10/03/2017 0055   NITRITE  NEGATIVE 10/03/2017 0055   LEUKOCYTESUR NEGATIVE 10/03/2017 0055   Sepsis Labs: (procalcitonin:4,lacticidven:4)  ) Recent Results (from the past 240 hour(s))  MRSA PCR Screening     Status: None   Collection Time: 10/03/17  3:52 PM  Result Value Ref Range Status   MRSA by PCR NEGATIVE NEGATIVE Final    Comment:        The GeneXpert MRSA Assay (FDA approved for NASAL specimens only), is one component of a comprehensive MRSA colonization surveillance program. It is not intended to diagnose MRSA infection nor to guide or monitor treatment for MRSA infections. Performed at [facility], [address] [place], [place] [address]       Studies: No results found.  Scheduled Meds: . amLODipine  5 mg Oral Daily  . AVEENO SOOTHING BATH TREATMENT  1 packet Topical Daily  .  clobetasol cream   Topical BID  . enoxaparin (LOVENOX) injection  40 mg Subcutaneous Q24H  . LORazepam  0-4 mg Oral Q6H   Followed by  . LORazepam  0-4 mg Oral Q12H  . losartan  100 mg Oral Daily    Continuous Infusions: . sodium chloride 10 mL/hr at 10/04/17 0500  . banana bag IV 1000 mL 75 mL/hr at 10/04/17 2000     LOS: 3 days     Darlin Drop, MD [facility] Pager [phone]  If 7PM-7AM, please contact night-coverage [email or web address] Password [email or web address] 10/06/2017, 10:00 AM

## 2017-10-06 NOTE — Discharge Instructions (Signed)
Alcohol Withdrawal When a person who drinks a lot of alcohol stops drinking, he or she may go through alcohol withdrawal. Alcohol withdrawal causes problems. It can make you feel:  Tired (fatigued).  Sad (depressed).  Fearful (anxious).  Grouchy (irritable).  Not hungry.  Sick to your stomach (nauseous).  Shaky.  It can also make you have:  Nightmares.  Trouble sleeping.  Trouble thinking clearly.  Mood swings.  Clammy skin.  Very bad sweating.  A very fast heartbeat.  Shaking that you cannot control (tremor).  Having a fever.  A fit of movements that you cannot control (seizure).  Confusion.  Throwing up (vomiting).  Feeling or seeing things that are not there (hallucinations).  Follow these instructions at home:  Take medicines and vitamins only as told by your doctor.  Do not drink alcohol.  Have someone around in case you need help.  Drink enough fluid to keep your pee (urine) clear or pale yellow.  Think about joining a group to help you stop drinking. Contact a doctor if:  Your problems get worse.  Your problems do not go away.  You cannot eat or drink without throwing up.  You are having a hard time not drinking alcohol.  You cannot stop drinking alcohol. Get help right away if:  You feel your heart beating differently than usual.  Your chest hurts.  You have trouble breathing.  You have very bad problems, like: ? A fever. ? A fit of movements that you cannot control. ? Being very confused. ? Feeling or seeing things that are not there. This information is not intended to replace advice given to you by your health care provider. Make sure you discuss any questions you have with your health care provider. Document Released: 11/05/2007 Document Revised: 10/25/2015 Document Reviewed: 03/07/2014 Elsevier Interactive Patient Education  2018 [employer].   What You Need To Know About Alcohol Abuse and Dependence, Adult Alcohol  is a widely available drug. People who use alcohol will consume it in varying amounts. People who drink alcohol in excess, and have behavior problems during and after drinking alcohol, may have what is called an alcohol use disorder. Alcohol abuse and alcohol dependence are the two main types of alcohol use disorders:  Alcohol abuse is when you use alcohol too much or too often. You may use alcohol to make yourself feel happy or to reduce stress, but you may have a hard time setting a limit on the amount you drink.  Alcohol dependence is when you use alcohol excessively for a period of time, and your body and brain chemistry changes as a result. This can make it hard to stop drinking because you may start to feel sick or feel different when you do not use alcohol.  How can alcohol abuse and dependence affect me? Alcohol abuse and dependence can have a negative effect on your life. Excessive use of alcohol may lead to an addiction. You may feel like you need alcohol to function normally. You may drink alcohol before work in the morning, during the day, or as soon as you get home from work in the evening. These actions can result in:  Poor performance at work.  Losing your job.  Financial problems.  Car crashes or criminal charges from driving after drinking alcohol.  Problems in your relationships with friends and family.  Losing the trust and respect of co-workers, friends, and family.  Drinking heavily over a long period of time can permanently damage your  body and brain, and can cause lifelong health issues, such as:  Liver disease.  Heart problems, high blood pressure, or stroke.  Damage to your pancreas.  Certain cancers.  Decreased ability to fight infections.  Numbness or tingling in hands or feet (neuropathy).  Brain damage.  Depression.  Early (premature) death.  When your body craves alcohol, it is easy to drink more than your body can handle. As a result, you may  overdose. Alcohol overdose is a serious situation that requires hospitalization. It may lead to permanent injuries or death. What are the benefits of avoiding alcohol use? Limiting or avoiding alcohol can help you:  Avoid risks to your body, brain, and relationships.  Avoid the risk of abusing or becoming dependent on alcohol.  Keep your mind and body healthy. As a result, you may be more likely to accomplish your life goals.  Avoid permanent injury, organ damage, or death due to alcohol use.  What steps can I take to stop drinking?  The best way to avoid alcohol abuse, dependence, and addiction is not to drink at all, or to drink measured amounts. Measured drinking means no more than 1 drink a day for nonpregnant women and 2 drinks a day for men. One drink equals 12 oz of beer, 5 oz of wine, or 1 oz of hard liquor.  Stop drinking if you have been drinking too much. This can be very hard to do if you are used to abusing alcohol. If you find it hard to stop drinking, talk about your experience with someone you trust. This person may be able to help you change your drinking behavior.  Instead of drinking alcohol, do something else, like a hobby or exercise.  Find healthy ways to cope with stress, such as exercise, meditation, or spending time with people you care about.  In social gatherings and places where there may be alcohol, make intentional choices to drink non-alcohol beverages.  If your family, co-workers, or friends drink, talk to them about supporting you in your efforts to stop drinking. Ask them not to drink around you. Spend more time with people who do not drink alcohol.  If you think that you have an alcohol dependency problem: ? Tell friends or family about your concerns. ? Talk with your health care provider or another health professional about where to get help. ? Work with a Paramedic and a Network engineer. ? Consider joining a support group for people  who struggle with alcohol abuse, dependence, and addiction. Where to find support: You can get support for preventing alcohol abuse, dependence, and addiction from:  Your health care provider.  Alcoholics Anonymous (AA): SalaryStart.tn  SMART Recovery: www.smartrecovery.org  Local treatment centers or chemical dependency counselors.  Where to find more information: Learn more about alcohol abuse and dependence from:  Centers for Disease Control and Prevention: GreenTraditions.fi  [employer] on Alcohol Abuse and Alcoholism: CyberComps.hu  Local AA groups in your community.  Contact a health care provider if:  You drink more or for longer than you intended, on more than one occasion.  You tried to stop drinking or to cut back on how much you drink, but you were not able to.  You often drink to the point of vomiting or passing out.  You want to drink so badly that you cannot think about anything else.  Drinking has created problems in your life, but you continue to drink.  You keep drinking even though you feel anxious, depressed,  or have experienced memory loss.  You have stopped doing the things you used to enjoy in order to drink.  You have to drink more than you used to in order to get the effect you want.  You experience anxiety, sweating, nausea, shakiness, and trouble sleeping when you try to stop drinking.  You have thoughts about hurting yourself or others. If you ever feel like you may hurt yourself or others, or have thoughts about taking your own life, get help right away. You can go to your nearest emergency department or call:  Your local emergency services (911 in the [place]  A suicide crisis helpline, such as the National Suicide Prevention Lifeline at 226 502 4636. This is open 24 hours a day.  Summary  Alcohol is a widely available drug. Misusing, abusing, and becoming  dependent on alcohol can cause many problems.  It is important to measure and limit the amount of alcohol you consume. It is recommended to limit alcohol use to 1 drink a day for nonpregnant women and 2 drinks a day for men.  The risks associated with drinking too much will have a direct negative impact on your work, relationships, and health.  If you realize that you are having some challenges keeping your drinking under control, find some ways to change your behavior. Hobbies, self calming activities, exercise, or support groups can help.  If you feel you need help with changing your drinking habits, talk with your health care provider, a good friend, or a therapist, or go to an AA group. This information is not intended to replace advice given to you by your health care provider. Make sure you discuss any questions you have with your health care provider. Document Released: 05/13/2016 Document Revised: 05/13/2016 Document Reviewed: 05/13/2016 Elsevier Interactive Patient Education  [employer].

## 2017-10-07 LAB — FOLATE RBC
FOLATE, HEMOLYSATE: 441.6 ng/mL
Folate, RBC: 1507 ng/mL (ref >498)
Hematocrit: 29.3 % — ABNORMAL LOW (ref 37.5–51.0)

## 2017-10-08 ENCOUNTER — Ambulatory Visit (HOSPITAL_COMMUNITY): Payer: Self-pay | Admitting: Psychology

## 2017-10-10 ENCOUNTER — Telehealth (HOSPITAL_COMMUNITY): Payer: Self-pay | Admitting: Psychology

## 2017-10-12 ENCOUNTER — Ambulatory Visit (HOSPITAL_COMMUNITY): Payer: Self-pay | Admitting: Psychology

## 2018-06-10 DIAGNOSIS — Z3141 Encounter for fertility testing: Secondary | ICD-10-CM | POA: Diagnosis not present

## 2018-06-11 DIAGNOSIS — Z3141 Encounter for fertility testing: Secondary | ICD-10-CM | POA: Diagnosis not present

## 2018-06-23 DIAGNOSIS — F4310 Post-traumatic stress disorder, unspecified: Secondary | ICD-10-CM | POA: Diagnosis not present

## 2018-06-23 DIAGNOSIS — Z1389 Encounter for screening for other disorder: Secondary | ICD-10-CM | POA: Diagnosis not present

## 2018-06-23 DIAGNOSIS — F332 Major depressive disorder, recurrent severe without psychotic features: Secondary | ICD-10-CM | POA: Diagnosis not present

## 2018-07-21 DIAGNOSIS — F1220 Cannabis dependence, uncomplicated: Secondary | ICD-10-CM | POA: Diagnosis not present

## 2018-07-21 DIAGNOSIS — Z1389 Encounter for screening for other disorder: Secondary | ICD-10-CM | POA: Diagnosis not present

## 2018-07-21 DIAGNOSIS — F4310 Post-traumatic stress disorder, unspecified: Secondary | ICD-10-CM | POA: Diagnosis not present

## 2018-07-29 DIAGNOSIS — F419 Anxiety disorder, unspecified: Secondary | ICD-10-CM | POA: Diagnosis not present

## 2018-07-29 DIAGNOSIS — R110 Nausea: Secondary | ICD-10-CM | POA: Diagnosis not present

## 2018-07-29 DIAGNOSIS — K589 Irritable bowel syndrome without diarrhea: Secondary | ICD-10-CM | POA: Diagnosis not present

## 2018-07-29 DIAGNOSIS — R748 Abnormal levels of other serum enzymes: Secondary | ICD-10-CM | POA: Diagnosis not present

## 2018-07-29 DIAGNOSIS — D649 Anemia, unspecified: Secondary | ICD-10-CM | POA: Diagnosis not present

## 2018-07-29 DIAGNOSIS — R7989 Other specified abnormal findings of blood chemistry: Secondary | ICD-10-CM | POA: Diagnosis not present

## 2018-08-19 DIAGNOSIS — Z87891 Personal history of nicotine dependence: Secondary | ICD-10-CM | POA: Diagnosis not present

## 2018-08-19 DIAGNOSIS — R1013 Epigastric pain: Secondary | ICD-10-CM | POA: Diagnosis not present

## 2018-08-19 DIAGNOSIS — R1011 Right upper quadrant pain: Secondary | ICD-10-CM | POA: Diagnosis not present

## 2018-09-08 DIAGNOSIS — F4310 Post-traumatic stress disorder, unspecified: Secondary | ICD-10-CM | POA: Diagnosis not present

## 2018-09-08 DIAGNOSIS — F1220 Cannabis dependence, uncomplicated: Secondary | ICD-10-CM | POA: Diagnosis not present

## 2018-10-27 DIAGNOSIS — M25552 Pain in left hip: Secondary | ICD-10-CM | POA: Diagnosis not present

## 2018-10-27 DIAGNOSIS — R002 Palpitations: Secondary | ICD-10-CM | POA: Diagnosis not present

## 2018-10-27 DIAGNOSIS — I10 Essential (primary) hypertension: Secondary | ICD-10-CM | POA: Diagnosis not present

## 2018-10-27 DIAGNOSIS — R5383 Other fatigue: Secondary | ICD-10-CM | POA: Diagnosis not present

## 2018-11-03 DIAGNOSIS — M25552 Pain in left hip: Secondary | ICD-10-CM | POA: Diagnosis not present

## 2018-11-08 DIAGNOSIS — W1789XA Other fall from one level to another, initial encounter: Secondary | ICD-10-CM | POA: Diagnosis not present

## 2018-11-08 DIAGNOSIS — S9031XA Contusion of right foot, initial encounter: Secondary | ICD-10-CM | POA: Diagnosis not present

## 2018-11-08 DIAGNOSIS — Y9339 Activity, other involving climbing, rappelling and jumping off: Secondary | ICD-10-CM | POA: Diagnosis not present

## 2018-11-08 DIAGNOSIS — M79671 Pain in right foot: Secondary | ICD-10-CM | POA: Diagnosis not present

## 2018-11-09 DIAGNOSIS — R002 Palpitations: Secondary | ICD-10-CM | POA: Diagnosis not present

## 2018-11-09 DIAGNOSIS — M25552 Pain in left hip: Secondary | ICD-10-CM | POA: Diagnosis not present

## 2018-11-09 DIAGNOSIS — R5383 Other fatigue: Secondary | ICD-10-CM | POA: Diagnosis not present

## 2018-11-09 DIAGNOSIS — I10 Essential (primary) hypertension: Secondary | ICD-10-CM | POA: Diagnosis not present

## 2018-11-18 DIAGNOSIS — S99922A Unspecified injury of left foot, initial encounter: Secondary | ICD-10-CM | POA: Diagnosis not present

## 2018-11-18 DIAGNOSIS — I10 Essential (primary) hypertension: Secondary | ICD-10-CM | POA: Diagnosis not present

## 2018-11-18 DIAGNOSIS — Z5181 Encounter for therapeutic drug level monitoring: Secondary | ICD-10-CM | POA: Diagnosis not present

## 2018-11-18 DIAGNOSIS — W19XXXA Unspecified fall, initial encounter: Secondary | ICD-10-CM | POA: Diagnosis not present

## 2018-11-22 DIAGNOSIS — Y30XXXA Falling, jumping or pushed from a high place, undetermined intent, initial encounter: Secondary | ICD-10-CM | POA: Diagnosis not present

## 2018-11-22 DIAGNOSIS — S92014A Nondisplaced fracture of body of right calcaneus, initial encounter for closed fracture: Secondary | ICD-10-CM | POA: Diagnosis not present

## 2018-11-22 DIAGNOSIS — M79671 Pain in right foot: Secondary | ICD-10-CM | POA: Diagnosis not present

## 2018-11-26 DIAGNOSIS — Z114 Encounter for screening for human immunodeficiency virus [HIV]: Secondary | ICD-10-CM | POA: Diagnosis not present

## 2018-11-26 DIAGNOSIS — I10 Essential (primary) hypertension: Secondary | ICD-10-CM | POA: Diagnosis not present

## 2018-11-26 DIAGNOSIS — Z13220 Encounter for screening for lipoid disorders: Secondary | ICD-10-CM | POA: Diagnosis not present

## 2018-11-26 DIAGNOSIS — Z125 Encounter for screening for malignant neoplasm of prostate: Secondary | ICD-10-CM | POA: Diagnosis not present

## 2018-12-08 DIAGNOSIS — F1220 Cannabis dependence, uncomplicated: Secondary | ICD-10-CM | POA: Diagnosis not present

## 2018-12-08 DIAGNOSIS — F4310 Post-traumatic stress disorder, unspecified: Secondary | ICD-10-CM | POA: Diagnosis not present

## 2018-12-29 DIAGNOSIS — S92014S Nondisplaced fracture of body of right calcaneus, sequela: Secondary | ICD-10-CM | POA: Diagnosis not present

## 2018-12-29 DIAGNOSIS — I10 Essential (primary) hypertension: Secondary | ICD-10-CM | POA: Diagnosis not present

## 2018-12-29 DIAGNOSIS — M25471 Effusion, right ankle: Secondary | ICD-10-CM | POA: Diagnosis not present

## 2019-01-03 DIAGNOSIS — S92014A Nondisplaced fracture of body of right calcaneus, initial encounter for closed fracture: Secondary | ICD-10-CM | POA: Diagnosis not present

## 2019-01-03 DIAGNOSIS — W1839XA Other fall on same level, initial encounter: Secondary | ICD-10-CM | POA: Diagnosis not present

## 2019-01-27 DIAGNOSIS — M25551 Pain in right hip: Secondary | ICD-10-CM | POA: Diagnosis not present

## 2019-01-27 DIAGNOSIS — M7061 Trochanteric bursitis, right hip: Secondary | ICD-10-CM | POA: Diagnosis not present

## 2019-01-27 DIAGNOSIS — M7062 Trochanteric bursitis, left hip: Secondary | ICD-10-CM | POA: Diagnosis not present

## 2019-01-27 DIAGNOSIS — M25552 Pain in left hip: Secondary | ICD-10-CM | POA: Diagnosis not present

## 2019-02-15 IMAGING — CT CT HEAD W/O CM
4 series · 15 of 47 positions shown, 17 images · non-contrast
Comparison: None.

CLINICAL DATA: 41-year-old male with hallucination.

EXAM:
CT HEAD WITHOUT CONTRAST
TECHNIQUE: Contiguous axial images were obtained from the base of the skull
through the vertex without intravenous contrast.

[Series 3: head without · axial · non-contrast · 0.44mm/px · z∈[-55,+70]mm · 7 of 35 slices shown, 9 images]
[im 5/35  brain]
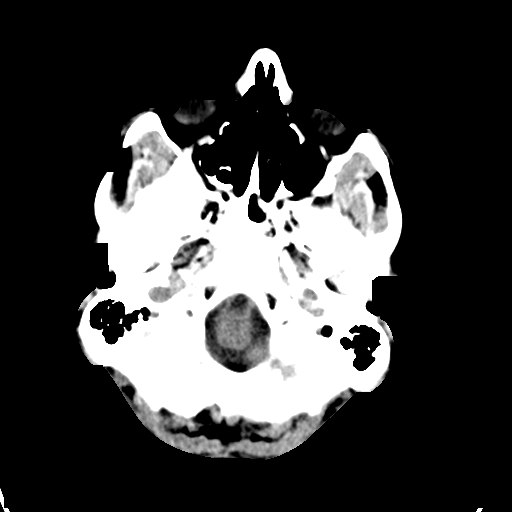
[im 5/35  bone]
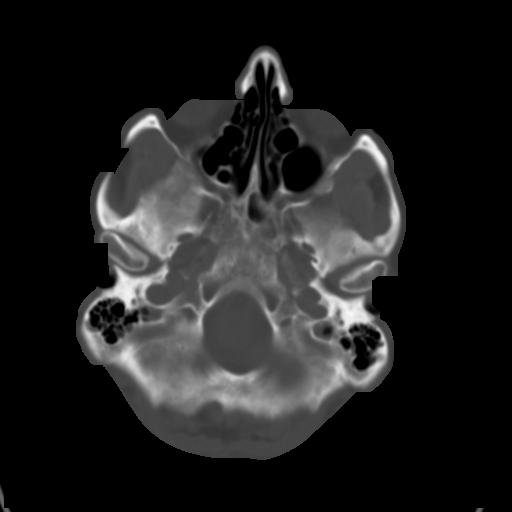
[im 9/35  brain]
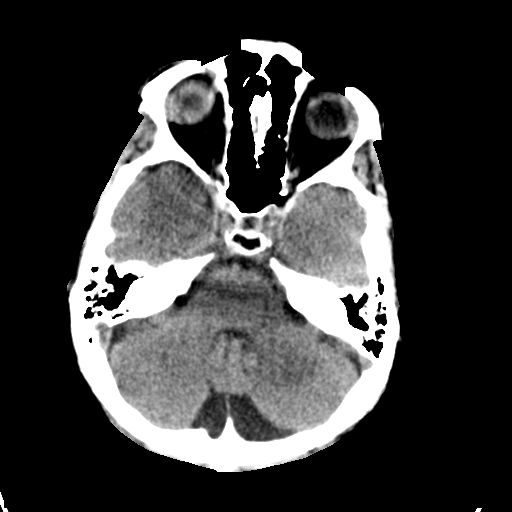
[im 13/35  brain]
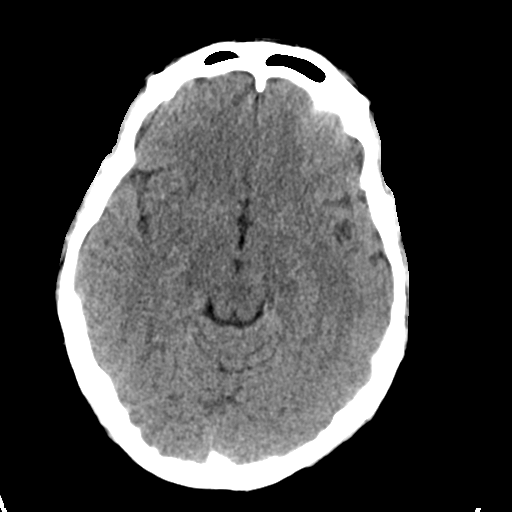
[im 18/35  brain]
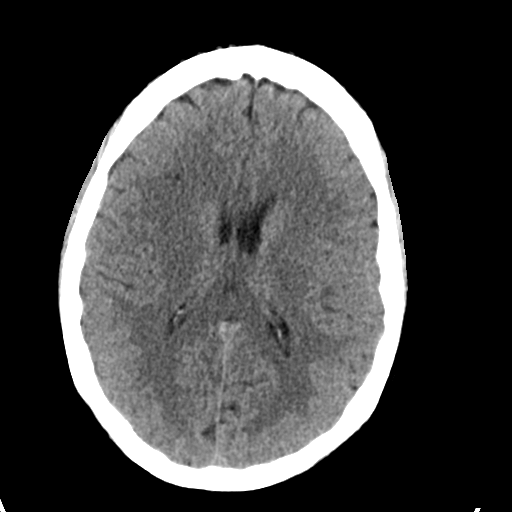
[im 22/35  brain]
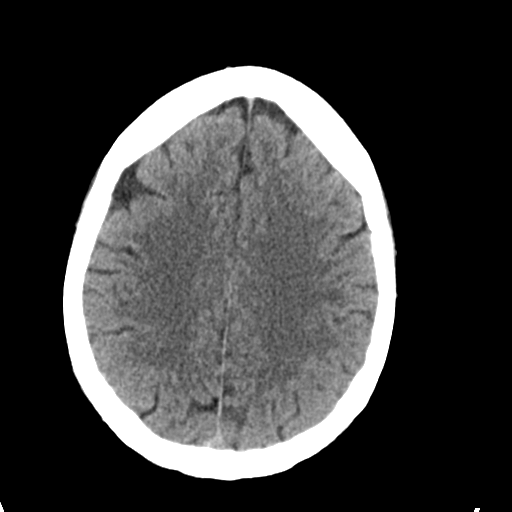
[im 22/35  bone]
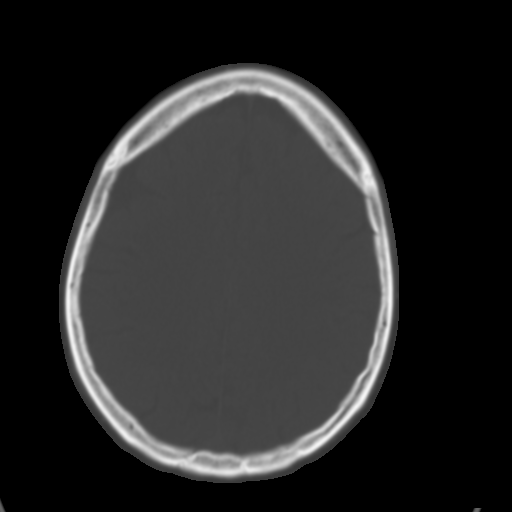
[im 26/35  brain]
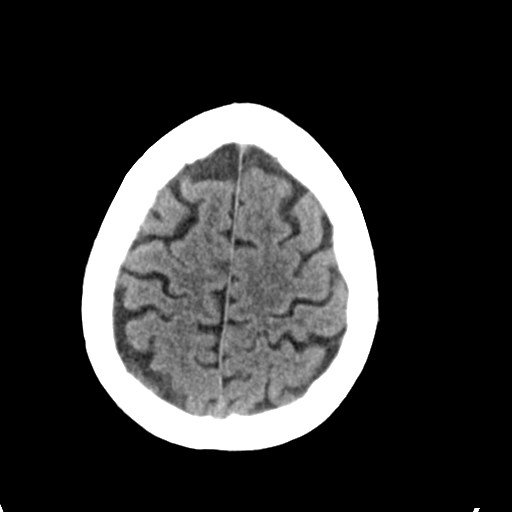
[im 30/35  brain]
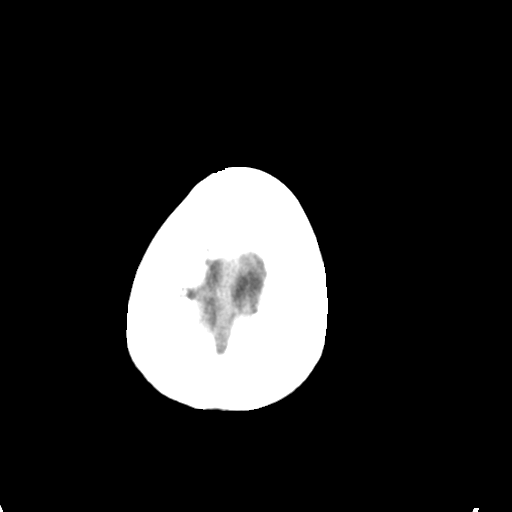

[Series 4: head bone · axial · 0.44mm/px · z∈[-59,-41]mm · 2 of 86 slices shown]
[im 9/86  bone]
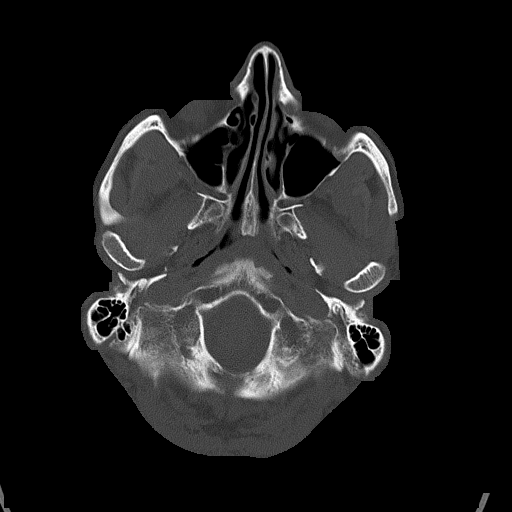
[im 18/86  bone]
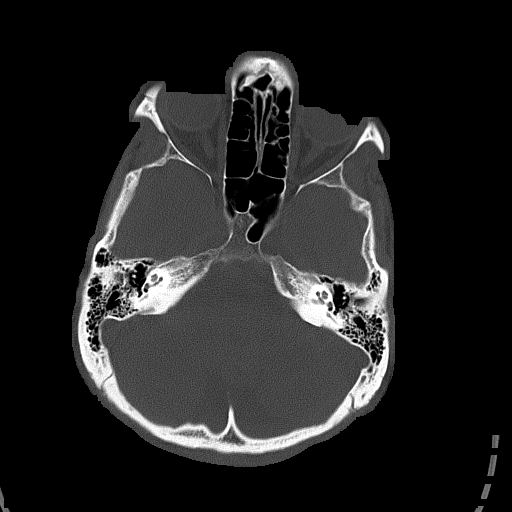

[Series 5: head without cor · coronal · non-contrast · 0.34mm/px · 3 of 76 slices shown]
[im 26/76  brain]
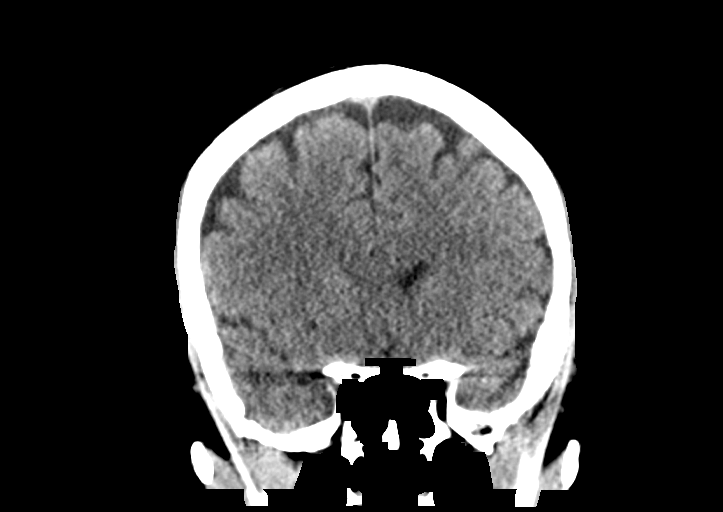
[im 34/76  brain]
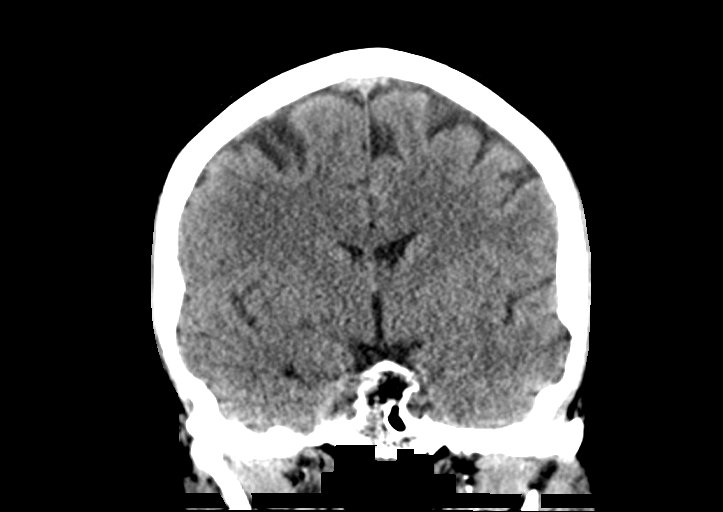
[im 42/76  brain]
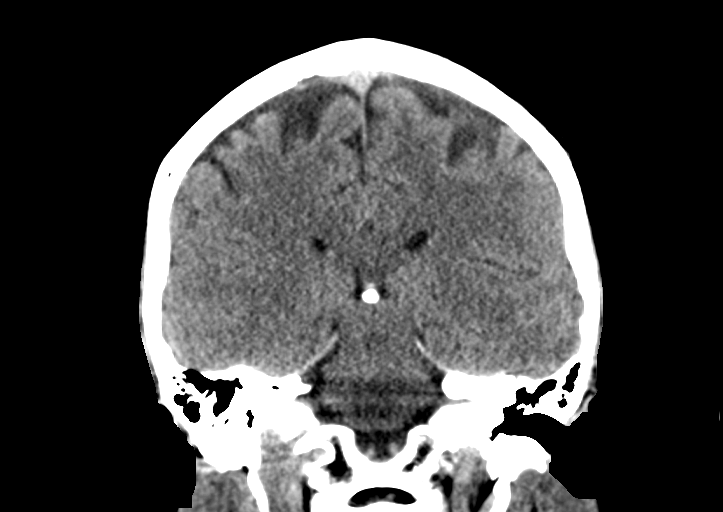

[Series 6: head without sag · sagittal · non-contrast · 0.34mm/px · 3 of 67 slices shown]
[im 23/67  brain]
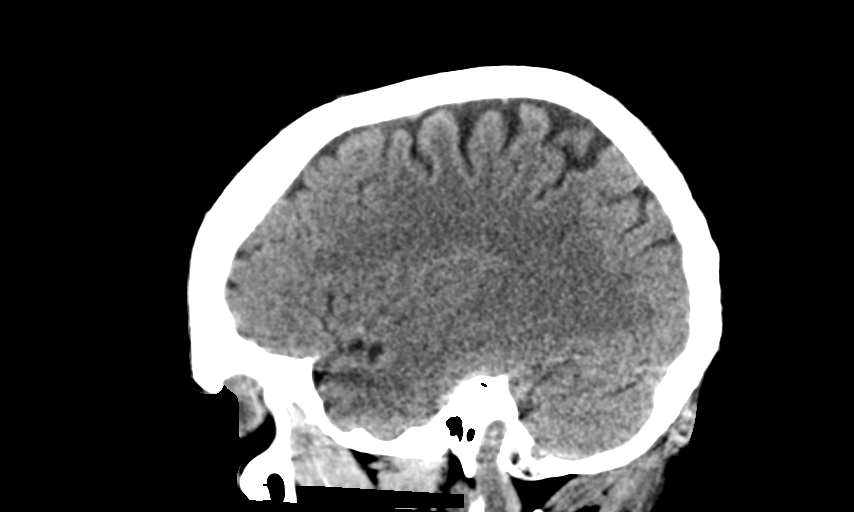
[im 34/67  brain]
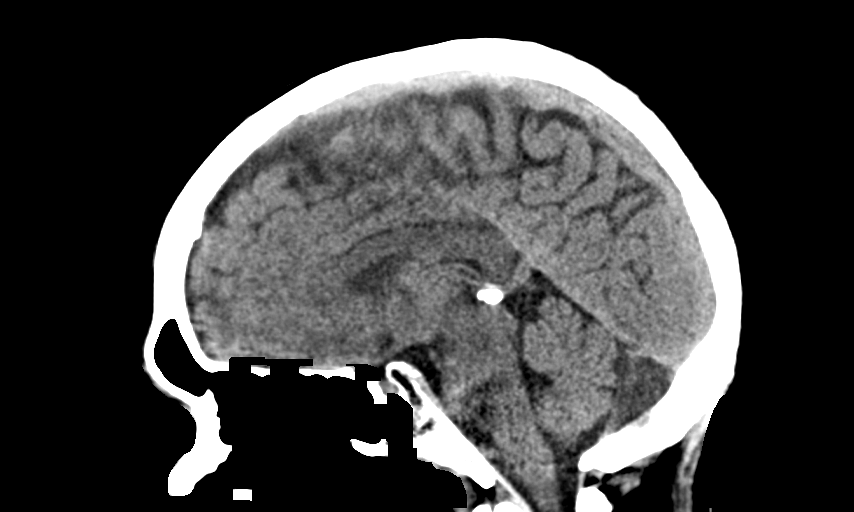
[im 45/67  brain]
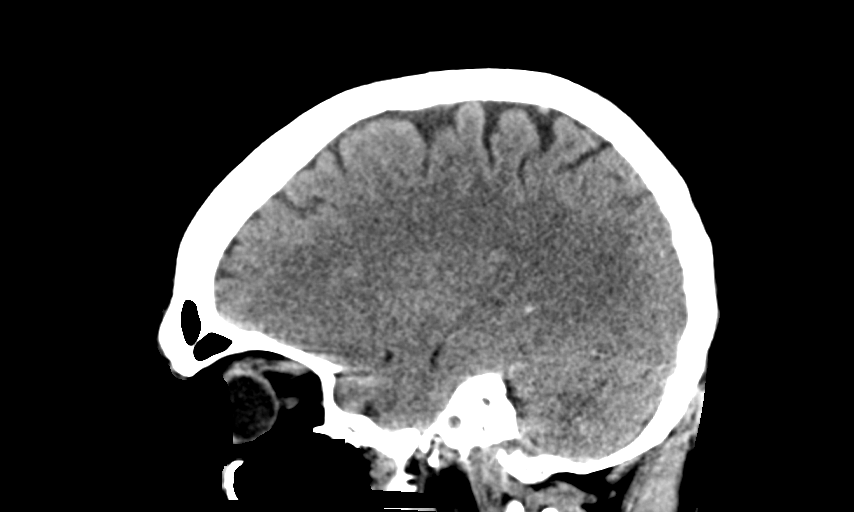

[15 of 47 positions shown; findings below may reference images not displayed]

FINDINGS: Brain: The ventricles and sulci appropriate size for patient's age.
The gray-white matter discrimination is preserved. There is no acute
intracranial hemorrhage. No mass effect or midline shift. Mild
prominence of the CSF space in the posterior fossa may represent an
arachnoid cyst or mildly dilated cisterna magna.

Vascular: No hyperdense vessel or unexpected calcification.

Skull: Normal. Negative for fracture or focal lesion.

Sinuses/Orbits: No acute finding.

Other: None
IMPRESSION: No acute intracranial pathology.

## 2019-03-03 DIAGNOSIS — N281 Cyst of kidney, acquired: Secondary | ICD-10-CM | POA: Diagnosis not present

## 2019-03-03 DIAGNOSIS — R110 Nausea: Secondary | ICD-10-CM | POA: Diagnosis not present

## 2019-03-03 DIAGNOSIS — K2980 Duodenitis without bleeding: Secondary | ICD-10-CM | POA: Diagnosis not present

## 2019-03-03 DIAGNOSIS — R1013 Epigastric pain: Secondary | ICD-10-CM | POA: Diagnosis not present

## 2019-03-03 DIAGNOSIS — R1084 Generalized abdominal pain: Secondary | ICD-10-CM | POA: Diagnosis not present

## 2019-03-03 DIAGNOSIS — K5790 Diverticulosis of intestine, part unspecified, without perforation or abscess without bleeding: Secondary | ICD-10-CM | POA: Diagnosis not present

## 2019-03-03 DIAGNOSIS — K760 Fatty (change of) liver, not elsewhere classified: Secondary | ICD-10-CM | POA: Diagnosis not present

## 2019-03-03 DIAGNOSIS — R197 Diarrhea, unspecified: Secondary | ICD-10-CM | POA: Diagnosis not present

## 2019-03-03 DIAGNOSIS — Z87891 Personal history of nicotine dependence: Secondary | ICD-10-CM | POA: Diagnosis not present

## 2019-03-08 DIAGNOSIS — S42032A Displaced fracture of lateral end of left clavicle, initial encounter for closed fracture: Secondary | ICD-10-CM | POA: Diagnosis not present

## 2019-03-08 DIAGNOSIS — M25512 Pain in left shoulder: Secondary | ICD-10-CM | POA: Diagnosis not present

## 2019-03-09 DIAGNOSIS — M25552 Pain in left hip: Secondary | ICD-10-CM | POA: Diagnosis not present

## 2019-03-09 DIAGNOSIS — M25551 Pain in right hip: Secondary | ICD-10-CM | POA: Diagnosis not present

## 2019-03-09 DIAGNOSIS — S42022D Displaced fracture of shaft of left clavicle, subsequent encounter for fracture with routine healing: Secondary | ICD-10-CM | POA: Diagnosis not present

## 2019-03-09 DIAGNOSIS — R2689 Other abnormalities of gait and mobility: Secondary | ICD-10-CM | POA: Diagnosis not present

## 2019-03-09 DIAGNOSIS — W1839XD Other fall on same level, subsequent encounter: Secondary | ICD-10-CM | POA: Diagnosis not present

## 2019-03-15 DIAGNOSIS — R1011 Right upper quadrant pain: Secondary | ICD-10-CM | POA: Diagnosis not present

## 2019-03-15 DIAGNOSIS — M25552 Pain in left hip: Secondary | ICD-10-CM | POA: Diagnosis not present

## 2019-03-15 DIAGNOSIS — R2689 Other abnormalities of gait and mobility: Secondary | ICD-10-CM | POA: Diagnosis not present

## 2019-03-15 DIAGNOSIS — M25551 Pain in right hip: Secondary | ICD-10-CM | POA: Diagnosis not present

## 2019-03-18 DIAGNOSIS — M25552 Pain in left hip: Secondary | ICD-10-CM | POA: Diagnosis not present

## 2019-03-18 DIAGNOSIS — R2689 Other abnormalities of gait and mobility: Secondary | ICD-10-CM | POA: Diagnosis not present

## 2019-03-18 DIAGNOSIS — M25551 Pain in right hip: Secondary | ICD-10-CM | POA: Diagnosis not present

## 2019-03-22 DIAGNOSIS — M25552 Pain in left hip: Secondary | ICD-10-CM | POA: Diagnosis not present

## 2019-03-22 DIAGNOSIS — M25551 Pain in right hip: Secondary | ICD-10-CM | POA: Diagnosis not present

## 2019-03-22 DIAGNOSIS — R2689 Other abnormalities of gait and mobility: Secondary | ICD-10-CM | POA: Diagnosis not present

## 2019-03-24 DIAGNOSIS — F4310 Post-traumatic stress disorder, unspecified: Secondary | ICD-10-CM | POA: Diagnosis not present

## 2019-03-24 DIAGNOSIS — F1220 Cannabis dependence, uncomplicated: Secondary | ICD-10-CM | POA: Diagnosis not present

## 2019-03-29 DIAGNOSIS — M25551 Pain in right hip: Secondary | ICD-10-CM | POA: Diagnosis not present

## 2019-03-29 DIAGNOSIS — R2689 Other abnormalities of gait and mobility: Secondary | ICD-10-CM | POA: Diagnosis not present

## 2019-03-29 DIAGNOSIS — M25552 Pain in left hip: Secondary | ICD-10-CM | POA: Diagnosis not present

## 2019-03-31 DIAGNOSIS — M25551 Pain in right hip: Secondary | ICD-10-CM | POA: Diagnosis not present

## 2019-03-31 DIAGNOSIS — M25552 Pain in left hip: Secondary | ICD-10-CM | POA: Diagnosis not present

## 2019-03-31 DIAGNOSIS — R2689 Other abnormalities of gait and mobility: Secondary | ICD-10-CM | POA: Diagnosis not present

## 2019-04-05 DIAGNOSIS — M25551 Pain in right hip: Secondary | ICD-10-CM | POA: Diagnosis not present

## 2019-04-05 DIAGNOSIS — M25552 Pain in left hip: Secondary | ICD-10-CM | POA: Diagnosis not present

## 2019-04-05 DIAGNOSIS — R2689 Other abnormalities of gait and mobility: Secondary | ICD-10-CM | POA: Diagnosis not present

## 2019-04-06 DIAGNOSIS — S42022D Displaced fracture of shaft of left clavicle, subsequent encounter for fracture with routine healing: Secondary | ICD-10-CM | POA: Diagnosis not present

## 2019-04-06 DIAGNOSIS — W1789XD Other fall from one level to another, subsequent encounter: Secondary | ICD-10-CM | POA: Diagnosis not present

## 2019-04-07 DIAGNOSIS — M25551 Pain in right hip: Secondary | ICD-10-CM | POA: Diagnosis not present

## 2019-04-07 DIAGNOSIS — M25552 Pain in left hip: Secondary | ICD-10-CM | POA: Diagnosis not present

## 2019-04-07 DIAGNOSIS — R2689 Other abnormalities of gait and mobility: Secondary | ICD-10-CM | POA: Diagnosis not present

## 2019-04-12 DIAGNOSIS — M25552 Pain in left hip: Secondary | ICD-10-CM | POA: Diagnosis not present

## 2019-04-12 DIAGNOSIS — M25551 Pain in right hip: Secondary | ICD-10-CM | POA: Diagnosis not present

## 2019-04-12 DIAGNOSIS — R2689 Other abnormalities of gait and mobility: Secondary | ICD-10-CM | POA: Diagnosis not present

## 2019-04-13 DIAGNOSIS — M76892 Other specified enthesopathies of left lower limb, excluding foot: Secondary | ICD-10-CM | POA: Diagnosis not present

## 2019-04-13 DIAGNOSIS — M87052 Idiopathic aseptic necrosis of left femur: Secondary | ICD-10-CM | POA: Diagnosis not present

## 2019-04-19 DIAGNOSIS — M25552 Pain in left hip: Secondary | ICD-10-CM | POA: Diagnosis not present

## 2019-04-19 DIAGNOSIS — M25551 Pain in right hip: Secondary | ICD-10-CM | POA: Diagnosis not present

## 2019-04-19 DIAGNOSIS — R2689 Other abnormalities of gait and mobility: Secondary | ICD-10-CM | POA: Diagnosis not present

## 2019-04-25 DIAGNOSIS — M25551 Pain in right hip: Secondary | ICD-10-CM | POA: Diagnosis not present

## 2019-04-25 DIAGNOSIS — M25552 Pain in left hip: Secondary | ICD-10-CM | POA: Diagnosis not present

## 2019-04-25 DIAGNOSIS — R2689 Other abnormalities of gait and mobility: Secondary | ICD-10-CM | POA: Diagnosis not present

## 2019-04-27 DIAGNOSIS — M25551 Pain in right hip: Secondary | ICD-10-CM | POA: Diagnosis not present

## 2019-04-27 DIAGNOSIS — R2689 Other abnormalities of gait and mobility: Secondary | ICD-10-CM | POA: Diagnosis not present

## 2019-04-27 DIAGNOSIS — M25552 Pain in left hip: Secondary | ICD-10-CM | POA: Diagnosis not present
